# Patient Record
Sex: Female | Born: 1990 | Race: Black or African American | Hispanic: No | Marital: Single | Smoking: Current every day smoker
Health system: Southern US, Community
[De-identification: ages and names within clinical notes are randomized; demographics above are authoritative.]

## PROBLEM LIST (undated history)

## (undated) DIAGNOSIS — Z789 Other specified health status: Secondary | ICD-10-CM

## (undated) HISTORY — PX: NO PAST SURGERIES: SHX2092

## (undated) HISTORY — DX: Other specified health status: Z78.9

---
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
## 2013-04-02 ENCOUNTER — Ambulatory Visit (INDEPENDENT_AMBULATORY_CARE_PROVIDER_SITE_OTHER): Payer: Medicaid Other | Admitting: Obstetrics & Gynecology

## 2013-04-02 ENCOUNTER — Encounter: Payer: Self-pay | Admitting: Obstetrics & Gynecology

## 2013-04-02 VITALS — BP 120/70 | Ht 71.5 in | Wt 216.0 lb

## 2013-04-02 DIAGNOSIS — Z3200 Encounter for pregnancy test, result unknown: Secondary | ICD-10-CM

## 2013-04-02 DIAGNOSIS — Z3201 Encounter for pregnancy test, result positive: Secondary | ICD-10-CM

## 2013-04-02 LAB — POCT URINE PREGNANCY: Preg Test, Ur: POSITIVE

## 2013-04-02 MED ORDER — CITRANATAL B-CALM 20-1 MG & 2 X 25 MG PO MISC: 1.0000 | Freq: Every day | ORAL | Status: DC

## 2013-04-02 NOTE — Addendum Note (Signed)
Addended by: Criss Alvine on: 04/02/2013 02:50 PM   Modules accepted: Orders

## 2013-04-09 ENCOUNTER — Other Ambulatory Visit: Payer: Self-pay | Admitting: Obstetrics & Gynecology

## 2013-04-09 DIAGNOSIS — O3680X0 Pregnancy with inconclusive fetal viability, not applicable or unspecified: Secondary | ICD-10-CM

## 2013-04-14 ENCOUNTER — Ambulatory Visit (INDEPENDENT_AMBULATORY_CARE_PROVIDER_SITE_OTHER): Payer: Medicaid Other

## 2013-04-14 ENCOUNTER — Other Ambulatory Visit: Payer: Self-pay | Admitting: Adult Health

## 2013-04-14 ENCOUNTER — Other Ambulatory Visit: Payer: Self-pay | Admitting: Obstetrics & Gynecology

## 2013-04-14 DIAGNOSIS — O30009 Twin pregnancy, unspecified number of placenta and unspecified number of amniotic sacs, unspecified trimester: Secondary | ICD-10-CM

## 2013-04-14 DIAGNOSIS — O3680X0 Pregnancy with inconclusive fetal viability, not applicable or unspecified: Secondary | ICD-10-CM

## 2013-04-14 DIAGNOSIS — O3680X2 Pregnancy with inconclusive fetal viability, fetus 2: Secondary | ICD-10-CM

## 2013-04-14 MED ORDER — DOXYLAMINE-PYRIDOXINE 10-10 MG PO TBEC: 10.0000 mg | DELAYED_RELEASE_TABLET | Freq: Every day | ORAL | Status: DC | PRN

## 2013-04-14 NOTE — Progress Notes (Signed)
U/S- twin gestation, DI/DI, Baby A- CRL c/w 6+6wks, FHR=157BPM, baby B- CRL c/w 7+0 wks, FHR=142 BPM, cx long and closed, bilateral adnexa WNL

## 2013-04-15 ENCOUNTER — Ambulatory Visit (INDEPENDENT_AMBULATORY_CARE_PROVIDER_SITE_OTHER): Payer: Medicaid Other | Admitting: Women's Health

## 2013-04-15 ENCOUNTER — Encounter: Payer: Self-pay | Admitting: Obstetrics & Gynecology

## 2013-04-15 ENCOUNTER — Other Ambulatory Visit (HOSPITAL_COMMUNITY)
Admission: RE | Admit: 2013-04-15 | Discharge: 2013-04-15 | Disposition: A | Discharge status: Home / Self Care | Payer: Medicaid Other | Source: Ambulatory Visit | Attending: Obstetrics & Gynecology | Admitting: Obstetrics & Gynecology

## 2013-04-15 ENCOUNTER — Encounter: Payer: Self-pay | Admitting: Women's Health

## 2013-04-15 VITALS — BP 120/60 | Ht 70.0 in | Wt 211.8 lb

## 2013-04-15 DIAGNOSIS — Z01419 Encounter for gynecological examination (general) (routine) without abnormal findings: Secondary | ICD-10-CM | POA: Insufficient documentation

## 2013-04-15 DIAGNOSIS — O3091 Multiple gestation, unspecified, first trimester: Secondary | ICD-10-CM

## 2013-04-15 DIAGNOSIS — F1920 Other psychoactive substance dependence, uncomplicated: Secondary | ICD-10-CM

## 2013-04-15 DIAGNOSIS — Z113 Encounter for screening for infections with a predominantly sexual mode of transmission: Secondary | ICD-10-CM | POA: Insufficient documentation

## 2013-04-15 DIAGNOSIS — O30009 Twin pregnancy, unspecified number of placenta and unspecified number of amniotic sacs, unspecified trimester: Secondary | ICD-10-CM

## 2013-04-15 DIAGNOSIS — O210 Mild hyperemesis gravidarum: Secondary | ICD-10-CM

## 2013-04-15 DIAGNOSIS — O09899 Supervision of other high risk pregnancies, unspecified trimester: Secondary | ICD-10-CM

## 2013-04-15 DIAGNOSIS — Z331 Pregnant state, incidental: Secondary | ICD-10-CM

## 2013-04-15 DIAGNOSIS — O0991 Supervision of high risk pregnancy, unspecified, first trimester: Secondary | ICD-10-CM

## 2013-04-15 DIAGNOSIS — O0990 Supervision of high risk pregnancy, unspecified, unspecified trimester: Secondary | ICD-10-CM | POA: Insufficient documentation

## 2013-04-15 DIAGNOSIS — Z1389 Encounter for screening for other disorder: Secondary | ICD-10-CM

## 2013-04-15 LAB — CBC
HCT: 37.8 % (ref 36.0–46.0)
MCV: 84.8 fL (ref 78.0–100.0)
RBC: 4.46 MIL/uL (ref 3.87–5.11)
WBC: 8.2 10*3/uL (ref 4.0–10.5)

## 2013-04-15 LAB — POCT URINALYSIS DIPSTICK

## 2013-04-15 LAB — URINALYSIS
Glucose, UA: NEGATIVE mg/dL
Hgb urine dipstick: NEGATIVE
Leukocytes, UA: NEGATIVE
Nitrite: NEGATIVE
Specific Gravity, Urine: 1.029 (ref 1.005–1.030)
Urobilinogen, UA: 1 mg/dL (ref 0.0–1.0)
pH: 6.5 (ref 5.0–8.0)

## 2013-04-15 NOTE — Progress Notes (Signed)
New OB packet given. Consents signed.  

## 2013-04-15 NOTE — Progress Notes (Signed)
  Subjective:    Cindy Sosa is a 22 y.o. G62P1001 African American female at [redacted]w[redacted]d by 7wk u/s, being seen today for her first obstetrical visit.  Her obstetrical history is significant for uncomplicated term SVD 11years ago..  Pregnancy history fully reviewed. Last THC use 1 week ago- counseled on cessation.  Twin D-Di gestation!  Patient reports increased vaginal d/c w/o odor or vulvovaginal itching/irritation. N/V- was given diclegis samples yesterday. Medicaid pending- will call when receives card to get rx.  Denies cramping, bleeding, uti s/s.   Filed Vitals:   04/15/13 1102  BP: 120/60  Weight: 211 lb 12.8 oz (96.072 kg)    HISTORY: OB History   Grav Para Term Preterm Abortions TAB SAB Ect Mult Living   2 1 1       1      # Outc Date GA Lbr Len/2nd Wgt Sex Del Anes PTL Lv   1 TRM 8/03 104w0d   F SVD EPI  Yes   2 CUR              Past Medical History  Diagnosis Date  . Medical history non-contributory    Past Surgical History  Procedure Laterality Date  . No past surgeries     Family History  Problem Relation Age of Onset  . Hypertension Paternal Grandmother   . Hypertension Paternal Aunt      Exam    Pelvic Exam:    Perineum: Normal Perineum   Vulva: normal   Vagina:  normal mucosa, normal discharge, no palpable nodules   Uterus   normal size/shape/contour for GA w/ twins     Cervix: normal   Adnexa: Not palpable   Urinary: urethral meatus normal    System:     Skin: normal coloration and turgor, no rashes    Neurologic: oriented, normal mood   Extremities: normal strength, tone, and muscle mass   HEENT PERRLA   Mouth/Teeth mucous membranes moist   Cardiovascular: regular rate and rhythm   Respiratory:  appears well, vitals normal, no respiratory distress, acyanotic, normal RR   Abdomen: soft, non-tender    Thin prep pap smear obtained    Assessment:    Pregnancy: G2P1001 Patient Active Problem List   Diagnosis Date Noted  . Supervision of  high-risk pregnancy 04/15/2013    Priority: High  . Multiple gestation 04/15/2013    Priority: High      [redacted]w[redacted]d G2P1001 New OB visit Di-Di Twin gestation N/V pregnancy THC use    Plan:     Initial labs drawn Continue prenatal vitamins Problem list reviewed and updated Reviewed n/v relief measures and warning s/s to report, to call when receives medicaid so can rx diclegis (has samples) Reviewed recommended weight gain based on pre-gravid BMI Encouraged well-balanced diet Genetic Screening discussed Integrated Screen: requested Cystic fibrosis screening discussed requested Ultrasound discussed; fetal survey: requested Follow up in 4 weeks for 1st NT/IT and visit [facility] form completed and faxed   Marge Duncans 04/15/2013 11:29 AM

## 2013-04-15 NOTE — Addendum Note (Signed)
Addended by: Cheral Marker on: 04/15/2013 05:59 PM   Modules accepted: Orders

## 2013-04-15 NOTE — Patient Instructions (Signed)
Pregnancy - First Trimester  During sexual intercourse, millions of sperm go into the vagina. Only 1 sperm will penetrate and fertilize the female egg while it is in the Fallopian tube. One week later, the fertilized egg implants into the wall of the uterus. An embryo begins to develop into a baby. At 6 to 8 weeks, the eyes and face are formed and the heartbeat can be seen on ultrasound. At the end of 12 weeks (first trimester), all the baby's organs are formed. Now that you are pregnant, you will want to do everything you can to have a healthy baby. Two of the most important things are to get good prenatal care and follow your caregiver's instructions. Prenatal care is all the medical care you receive before the baby's birth. It is given to prevent, find, and treat problems during the pregnancy and childbirth.  PRENATAL EXAMS  · During prenatal visits, your weight, blood pressure, and urine are checked. This is done to make sure you are healthy and progressing normally during the pregnancy.  · A pregnant woman should gain 25 to 35 pounds during the pregnancy. However, if you are overweight or underweight, your caregiver will advise you regarding your weight.  · Your caregiver will ask and answer questions for you.  · Blood work, cervical cultures, other necessary tests, and a Pap test are done during your prenatal exams. These tests are done to check on your health and the probable health of your baby. Tests are strongly recommended and done for HIV with your permission. This is the virus that causes AIDS. These tests are done because medicines can be given to help prevent your baby from being born with this infection should you have been infected without knowing it. Blood work is also used to find out your blood type, previous infections, and follow your blood levels (hemoglobin).  · Low hemoglobin (anemia) is common during pregnancy. Iron and vitamins are given to help prevent this. Later in the pregnancy, blood  tests for diabetes will be done along with any other tests if any problems develop.  · You may need other tests to make sure you and the baby are doing well.  CHANGES DURING THE FIRST TRIMESTER   Your body goes through many changes during pregnancy. They vary from person to person. Talk to your caregiver about changes you notice and are concerned about. Changes can include:  · Your menstrual period stops.  · The egg and sperm carry the genes that determine what you look like. Genes from you and your partner are forming a baby. The female genes determine whether the baby is a boy or a girl.  · Your body increases in girth and you may feel bloated.  · Feeling sick to your stomach (nauseous) and throwing up (vomiting). If the vomiting is uncontrollable, call your caregiver.  · Your breasts will begin to enlarge and become tender.  · Your nipples may stick out more and become darker.  · The need to urinate more. Painful urination may mean you have a bladder infection.  · Tiring easily.  · Loss of appetite.  · Cravings for certain kinds of food.  · At first, you may gain or lose a couple of pounds.  · You may have changes in your emotions from day to day (excited to be pregnant or concerned something may go wrong with the pregnancy and baby).  · You may have more vivid and strange dreams.  HOME CARE INSTRUCTIONS   ·   It is very important to avoid all smoking, alcohol and non-prescribed drugs during your pregnancy. These affect the formation and growth of the baby. Avoid chemicals while pregnant to ensure the delivery of a healthy infant.  · Start your prenatal visits by the 12th week of pregnancy. They are usually scheduled monthly at first, then more often in the last 2 months before delivery. Keep your caregiver's appointments. Follow your caregiver's instructions regarding medicine use, blood and lab tests, exercise, and diet.  · During pregnancy, you are providing food for you and your baby. Eat regular, well-balanced  meals. Choose foods such as meat, fish, milk and other low fat dairy products, vegetables, fruits, and whole-grain breads and cereals. Your caregiver will tell you of the ideal weight gain.  · You can help morning sickness by keeping soda crackers at the bedside. Eat a couple before arising in the morning. You may want to use the crackers without salt on them.  · Eating 4 to 5 small meals rather than 3 large meals a day also may help the nausea and vomiting.  · Drinking liquids between meals instead of during meals also seems to help nausea and vomiting.  · A physical sexual relationship may be continued throughout pregnancy if there are no other problems. Problems may be early (premature) leaking of amniotic fluid from the membranes, vaginal bleeding, or belly (abdominal) pain.  · Exercise regularly if there are no restrictions. Check with your caregiver or physical therapist if you are unsure of the safety of some of your exercises. Greater weight gain will occur in the last 2 trimesters of pregnancy. Exercising will help:  · Control your weight.  · Keep you in shape.  · Prepare you for labor and delivery.  · Help you lose your pregnancy weight after you deliver your baby.  · Wear a good support or jogging bra for breast tenderness during pregnancy. This may help if worn during sleep too.  · Ask when prenatal classes are available. Begin classes when they are offered.  · Do not use hot tubs, steam rooms, or saunas.  · Wear your seat belt when driving. This protects you and your baby if you are in an accident.  · Avoid raw meat, uncooked cheese, cat litter boxes, and soil used by cats throughout the pregnancy. These carry germs that can cause birth defects in the baby.  · The first trimester is a good time to visit your dentist for your dental health. Getting your teeth cleaned is okay. Use a softer toothbrush and brush gently during pregnancy.  · Ask for help if you have financial, counseling, or nutritional needs  during pregnancy. Your caregiver will be able to offer counseling for these needs as well as refer you for other special needs.  · Do not take any medicines or herbs unless told by your caregiver.  · Inform your caregiver if there is any mental or physical domestic violence.  · Make a list of emergency phone numbers of family, friends, hospital, and police and fire departments.  · Write down your questions. Take them to your prenatal visit.  · Do not douche.  · Do not cross your legs.  · If you have to stand for long periods of time, rotate you feet or take small steps in a circle.  · You may have more vaginal secretions that may require a sanitary pad. Do not use tampons or scented sanitary pads.  MEDICINES AND DRUG USE IN PREGNANCY  ·   Take prenatal vitamins as directed. The vitamin should contain 1 milligram of folic acid. Keep all vitamins out of reach of children. Only a couple vitamins or tablets containing iron may be fatal to a baby or young child when ingested.  · Avoid use of all medicines, including herbs, over-the-counter medicines, not prescribed or suggested by your caregiver. Only take over-the-counter or prescription medicines for pain, discomfort, or fever as directed by your caregiver. Do not use aspirin, ibuprofen, or naproxen unless directed by your caregiver.  · Let your caregiver also know about herbs you may be using.  · Alcohol is related to a number of birth defects. This includes fetal alcohol syndrome. All alcohol, in any form, should be avoided completely. Smoking will cause low birth rate and premature babies.  · Street or illegal drugs are very harmful to the baby. They are absolutely forbidden. A baby born to an addicted mother will be addicted at birth. The baby will go through the same withdrawal an adult does.  · Let your caregiver know about any medicines that you have to take and for what reason you take them.  SEEK MEDICAL CARE IF:   You have any concerns or worries during your  pregnancy. It is better to call with your questions if you feel they cannot wait, rather than worry about them.  SEEK IMMEDIATE MEDICAL CARE IF:   · An unexplained oral temperature above 102° F (38.9° C) develops, or as your caregiver suggests.  · You have leaking of fluid from the vagina (birth canal). If leaking membranes are suspected, take your temperature and inform your caregiver of this when you call.  · There is vaginal spotting or bleeding. Notify your caregiver of the amount and how many pads are used.  · You develop a bad smelling vaginal discharge with a change in the color.  · You continue to feel sick to your stomach (nauseated) and have no relief from remedies suggested. You vomit blood or coffee ground-like materials.  · You lose more than 2 pounds of weight in 1 week.  · You gain more than 2 pounds of weight in 1 week and you notice swelling of your face, hands, feet, or legs.  · You gain 5 pounds or more in 1 week (even if you do not have swelling of your hands, face, legs, or feet).  · You get exposed to German measles and have never had them.  · You are exposed to fifth disease or chickenpox.  · You develop belly (abdominal) pain. Round ligament discomfort is a common non-cancerous (benign) cause of abdominal pain in pregnancy. Your caregiver still must evaluate this.  · You develop headache, fever, diarrhea, pain with urination, or shortness of breath.  · You fall or are in a car accident or have any kind of trauma.  · There is mental or physical violence in your home.  Document Released: 09/19/2001 Document Revised: 06/19/2012 Document Reviewed: 03/23/2009  [employer]® Patient Information ©2014 [employer].

## 2013-04-16 LAB — DRUG SCREEN, URINE, NO CONFIRMATION
Barbiturate Quant, Ur: NEGATIVE
Benzodiazepines.: NEGATIVE
Cocaine Metabolites: NEGATIVE
Opiate Screen, Urine: NEGATIVE
Phencyclidine (PCP): NEGATIVE

## 2013-04-16 LAB — ABO AND RH: Rh Type: POSITIVE

## 2013-04-16 LAB — ANTIBODY SCREEN: Antibody Screen: NEGATIVE

## 2013-04-16 LAB — HEPATITIS B SURFACE ANTIGEN: Hepatitis B Surface Ag: NEGATIVE

## 2013-04-16 LAB — SICKLE CELL SCREEN: Sickle Cell Screen: NEGATIVE

## 2013-04-17 ENCOUNTER — Encounter: Payer: Self-pay | Admitting: Women's Health

## 2013-04-17 DIAGNOSIS — F1290 Cannabis use, unspecified, uncomplicated: Secondary | ICD-10-CM | POA: Insufficient documentation

## 2013-04-18 LAB — CYSTIC FIBROSIS DIAGNOSTIC STUDY

## 2013-04-19 ENCOUNTER — Encounter: Payer: Self-pay | Admitting: Women's Health

## 2013-05-13 ENCOUNTER — Other Ambulatory Visit: Payer: Self-pay | Admitting: Obstetrics & Gynecology

## 2013-05-13 ENCOUNTER — Ambulatory Visit (INDEPENDENT_AMBULATORY_CARE_PROVIDER_SITE_OTHER): Payer: Medicaid Other

## 2013-05-13 ENCOUNTER — Ambulatory Visit (INDEPENDENT_AMBULATORY_CARE_PROVIDER_SITE_OTHER): Payer: Medicaid Other | Admitting: Obstetrics & Gynecology

## 2013-05-13 ENCOUNTER — Encounter: Payer: Self-pay | Admitting: Obstetrics & Gynecology

## 2013-05-13 VITALS — BP 110/80 | Wt 220.0 lb

## 2013-05-13 DIAGNOSIS — F1920 Other psychoactive substance dependence, uncomplicated: Secondary | ICD-10-CM

## 2013-05-13 DIAGNOSIS — O30009 Twin pregnancy, unspecified number of placenta and unspecified number of amniotic sacs, unspecified trimester: Secondary | ICD-10-CM

## 2013-05-13 DIAGNOSIS — Z1389 Encounter for screening for other disorder: Secondary | ICD-10-CM

## 2013-05-13 DIAGNOSIS — Z331 Pregnant state, incidental: Secondary | ICD-10-CM

## 2013-05-13 DIAGNOSIS — O99320 Drug use complicating pregnancy, unspecified trimester: Secondary | ICD-10-CM

## 2013-05-13 DIAGNOSIS — Z36 Encounter for antenatal screening of mother: Secondary | ICD-10-CM

## 2013-05-13 DIAGNOSIS — O0991 Supervision of high risk pregnancy, unspecified, first trimester: Secondary | ICD-10-CM

## 2013-05-13 LAB — POCT URINALYSIS DIPSTICK
Blood, UA: NEGATIVE
Glucose, UA: NEGATIVE
Ketones, UA: NEGATIVE

## 2013-05-13 NOTE — Patient Instructions (Signed)
Pregnancy - Second Trimester The second trimester of pregnancy (3 to 6 months) is a period of rapid growth for you and your baby. At the end of the sixth month, your baby is about 9 inches long and weighs 1 1/2 pounds. You will begin to feel the baby move between 18 and 20 weeks of the pregnancy. This is called quickening. Weight gain is faster. A clear fluid (colostrum) may leak out of your breasts. You may feel small contractions of the womb (uterus). This is known as false labor or Braxton-Hicks contractions. This is like a practice for labor when the baby is ready to be born. Usually, the problems with morning sickness have usually passed by the end of your first trimester. Some women develop small dark blotches (called cholasma, mask of pregnancy) on their face that usually goes away after the baby is born. Exposure to the sun makes the blotches worse. Acne may also develop in some pregnant women and pregnant women who have acne, may find that it goes away. PRENATAL EXAMS  Blood work may continue to be done during prenatal exams. These tests are done to check on your health and the probable health of your baby. Blood work is used to follow your blood levels (hemoglobin). Anemia (low hemoglobin) is common during pregnancy. Iron and vitamins are given to help prevent this. You will also be checked for diabetes between 24 and 28 weeks of the pregnancy. Some of the previous blood tests may be repeated.  The size of the uterus is measured during each visit. This is to make sure that the baby is continuing to grow properly according to the dates of the pregnancy.  Your blood pressure is checked every prenatal visit. This is to make sure you are not getting toxemia.  Your urine is checked to make sure you do not have an infection, diabetes or protein in the urine.  Your weight is checked often to make sure gains are happening at the suggested rate. This is to ensure that both you and your baby are  growing normally.  Sometimes, an ultrasound is performed to confirm the proper growth and development of the baby. This is a test which bounces harmless sound waves off the baby so your caregiver can more accurately determine due dates. Sometimes, a test is done on the amniotic fluid surrounding the baby. This test is called an amniocentesis. The amniotic fluid is obtained by sticking a needle into the belly (abdomen). This is done to check the chromosomes in instances where there is a concern about possible genetic problems with the baby. It is also sometimes done near the end of pregnancy if an early delivery is required. In this case, it is done to help make sure the baby's lungs are mature enough for the baby to live outside of the womb. CHANGES OCCURING IN THE SECOND TRIMESTER OF PREGNANCY Your body goes through many changes during pregnancy. They vary from person to person. Talk to your caregiver about changes you notice that you are concerned about.  During the second trimester, you will likely have an increase in your appetite. It is normal to have cravings for certain foods. This varies from person to person and pregnancy to pregnancy.  Your lower abdomen will begin to bulge.  You may have to urinate more often because the uterus and baby are pressing on your bladder. It is also common to get more bladder infections during pregnancy. You can help this by drinking lots of fluids   and emptying your bladder before and after intercourse.  You may begin to get stretch marks on your hips, abdomen, and breasts. These are normal changes in the body during pregnancy. There are no exercises or medicines to take that prevent this change.  You may begin to develop swollen and bulging veins (varicose veins) in your legs. Wearing support hose, elevating your feet for 15 minutes, 3 to 4 times a day and limiting salt in your diet helps lessen the problem.  Heartburn may develop as the uterus grows and  pushes up against the stomach. Antacids recommended by your caregiver helps with this problem. Also, eating smaller meals 4 to 5 times a day helps.  Constipation can be treated with a stool softener or adding bulk to your diet. Drinking lots of fluids, and eating vegetables, fruits, and whole grains are helpful.  Exercising is also helpful. If you have been very active up until your pregnancy, most of these activities can be continued during your pregnancy. If you have been less active, it is helpful to start an exercise program such as walking.  Hemorrhoids may develop at the end of the second trimester. Warm sitz baths and hemorrhoid cream recommended by your caregiver helps hemorrhoid problems.  Backaches may develop during this time of your pregnancy. Avoid heavy lifting, wear low heal shoes, and practice good posture to help with backache problems.  Some pregnant women develop tingling and numbness of their hand and fingers because of swelling and tightening of ligaments in the wrist (carpel tunnel syndrome). This goes away after the baby is born.  As your breasts enlarge, you may have to get a bigger bra. Get a comfortable, cotton, support bra. Do not get a nursing bra until the last month of the pregnancy if you will be nursing the baby.  You may get a dark line from your belly button to the pubic area called the linea nigra.  You may develop rosy cheeks because of increase blood flow to the face.  You may develop spider looking lines of the face, neck, arms, and chest. These go away after the baby is born. HOME CARE INSTRUCTIONS   It is extremely important to avoid all smoking, herbs, alcohol, and unprescribed drugs during your pregnancy. These chemicals affect the formation and growth of the baby. Avoid these chemicals throughout the pregnancy to ensure the delivery of a healthy infant.  Most of your home care instructions are the same as suggested for the first trimester of your  pregnancy. Keep your caregiver's appointments. Follow your caregiver's instructions regarding medicine use, exercise, and diet.  During pregnancy, you are providing food for you and your baby. Continue to eat regular, well-balanced meals. Choose foods such as meat, fish, milk and other low fat dairy products, vegetables, fruits, and whole-grain breads and cereals. Your caregiver will tell you of the ideal weight gain.  A physical sexual relationship may be continued up until near the end of pregnancy if there are no other problems. Problems could include early (premature) leaking of amniotic fluid from the membranes, vaginal bleeding, abdominal pain, or other medical or pregnancy problems.  Exercise regularly if there are no restrictions. Check with your caregiver if you are unsure of the safety of some of your exercises. The greatest weight gain will occur in the last 2 trimesters of pregnancy. Exercise will help you:  Control your weight.  Get you in shape for labor and delivery.  Lose weight after you have the baby.  Wear   a good support or jogging bra for breast tenderness during pregnancy. This may help if worn during sleep. Pads or tissues may be used in the bra if you are leaking colostrum.  Do not use hot tubs, steam rooms or saunas throughout the pregnancy.  Wear your seat belt at all times when driving. This protects you and your baby if you are in an accident.  Avoid raw meat, uncooked cheese, cat litter boxes, and soil used by cats. These carry germs that can cause birth defects in the baby.  The second trimester is also a good time to visit your dentist for your dental health if this has not been done yet. Getting your teeth cleaned is okay. Use a soft toothbrush. Brush gently during pregnancy.  It is easier to leak urine during pregnancy. Tightening up and strengthening the pelvic muscles will help with this problem. Practice stopping your urination while you are going to the  bathroom. These are the same muscles you need to strengthen. It is also the muscles you would use as if you were trying to stop from passing gas. You can practice tightening these muscles up 10 times a set and repeating this about 3 times per day. Once you know what muscles to tighten up, do not perform these exercises during urination. It is more likely to contribute to an infection by backing up the urine.  Ask for help if you have financial, counseling, or nutritional needs during pregnancy. Your caregiver will be able to offer counseling for these needs as well as refer you for other special needs.  Your skin may become oily. If so, wash your face with mild soap, use non-greasy moisturizer and oil or cream based makeup. MEDICINES AND DRUG USE IN PREGNANCY  Take prenatal vitamins as directed. The vitamin should contain 1 milligram of folic acid. Keep all vitamins out of reach of children. Only a couple vitamins or tablets containing iron may be fatal to a baby or young child when ingested.  Avoid use of all medicines, including herbs, over-the-counter medicines, not prescribed or suggested by your caregiver. Only take over-the-counter or prescription medicines for pain, discomfort, or fever as directed by your caregiver. Do not use aspirin.  Let your caregiver also know about herbs you may be using.  Alcohol is related to a number of birth defects. This includes fetal alcohol syndrome. All alcohol, in any form, should be avoided completely. Smoking will cause low birth rate and premature babies.  Street or illegal drugs are very harmful to the baby. They are absolutely forbidden. A baby born to an addicted mother will be addicted at birth. The baby will go through the same withdrawal an adult does. SEEK MEDICAL CARE IF:  You have any concerns or worries during your pregnancy. It is better to call with your questions if you feel they cannot wait, rather than worry about them. SEEK IMMEDIATE  MEDICAL CARE IF:   An unexplained oral temperature above 102 F (38.9 C) develops, or as your caregiver suggests.  You have leaking of fluid from the vagina (birth canal). If leaking membranes are suspected, take your temperature and tell your caregiver of this when you call.  There is vaginal spotting, bleeding, or passing clots. Tell your caregiver of the amount and how many pads are used. Light spotting in pregnancy is common, especially following intercourse.  You develop a bad smelling vaginal discharge with a change in the color from clear to white.  You continue to feel   sick to your stomach (nauseated) and have no relief from remedies suggested. You vomit blood or coffee ground-like materials.  You lose more than 2 pounds of weight or gain more than 2 pounds of weight over 1 week, or as suggested by your caregiver.  You notice swelling of your face, hands, feet, or legs.  You get exposed to German measles and have never had them.  You are exposed to fifth disease or chickenpox.  You develop belly (abdominal) pain. Round ligament discomfort is a common non-cancerous (benign) cause of abdominal pain in pregnancy. Your caregiver still must evaluate you.  You develop a bad headache that does not go away.  You develop fever, diarrhea, pain with urination, or shortness of breath.  You develop visual problems, blurry, or double vision.  You fall or are in a car accident or any kind of trauma.  There is mental or physical violence at home. Document Released: 09/19/2001 Document Revised: 06/19/2012 Document Reviewed: 03/24/2009 [employer] Patient Information 2014 [employer].  

## 2013-05-13 NOTE — Progress Notes (Signed)
Sonogram reviewed see report No bleeding  No other complaints 1st IT draw today

## 2013-05-13 NOTE — Progress Notes (Addendum)
U/S(11+1wks)-twins, Di-Di membrane noted, Baby A- active fetus, CRL c/w dates, NB present, NT=1.54mm, Baby B- active fetus, CRL c/w dates, NB present, NT=1.74mm, cx long and closed(3.4cm), bilateral adnexa WNL

## 2013-05-13 NOTE — Progress Notes (Signed)
Had U/S today. 

## 2013-06-10 ENCOUNTER — Other Ambulatory Visit: Payer: Self-pay | Admitting: Women's Health

## 2013-06-10 ENCOUNTER — Encounter: Payer: Self-pay | Admitting: Women's Health

## 2013-06-10 ENCOUNTER — Ambulatory Visit (INDEPENDENT_AMBULATORY_CARE_PROVIDER_SITE_OTHER): Payer: Medicaid Other | Admitting: Women's Health

## 2013-06-10 VITALS — BP 112/70 | Wt 233.0 lb

## 2013-06-10 DIAGNOSIS — O30009 Twin pregnancy, unspecified number of placenta and unspecified number of amniotic sacs, unspecified trimester: Secondary | ICD-10-CM

## 2013-06-10 DIAGNOSIS — O0992 Supervision of high risk pregnancy, unspecified, second trimester: Secondary | ICD-10-CM

## 2013-06-10 DIAGNOSIS — Z331 Pregnant state, incidental: Secondary | ICD-10-CM

## 2013-06-10 DIAGNOSIS — Z1389 Encounter for screening for other disorder: Secondary | ICD-10-CM

## 2013-06-10 DIAGNOSIS — O09899 Supervision of other high risk pregnancies, unspecified trimester: Secondary | ICD-10-CM

## 2013-06-10 DIAGNOSIS — F1920 Other psychoactive substance dependence, uncomplicated: Secondary | ICD-10-CM

## 2013-06-10 LAB — POCT URINALYSIS DIPSTICK
Blood, UA: NEGATIVE
Glucose, UA: NEGATIVE
Ketones, UA: NEGATIVE

## 2013-06-10 NOTE — Progress Notes (Signed)
Having some cramping off and on. 

## 2013-06-10 NOTE — Progress Notes (Signed)
Denies uc's, lof, vb, urinary frequency, urgency, hesitancy, or dysuria.  Rare cramp when hasn't emptied bladder.  Reviewed emptying bladder as needed, warning s/s to report.  All questions answered. 2nd IT today. F/U in 4wks for anatomy u/s and visit.

## 2013-06-10 NOTE — Patient Instructions (Signed)
Pregnancy - Second Trimester The second trimester of pregnancy (3 to 6 months) is a period of rapid growth for you and your baby. At the end of the sixth month, your baby is about 9 inches long and weighs 1 1/2 pounds. You will begin to feel the baby move between 18 and 20 weeks of the pregnancy. This is called quickening. Weight gain is faster. A clear fluid (colostrum) may leak out of your breasts. You may feel small contractions of the womb (uterus). This is known as false labor or Braxton-Hicks contractions. This is like a practice for labor when the baby is ready to be born. Usually, the problems with morning sickness have usually passed by the end of your first trimester. Some women develop small dark blotches (called cholasma, mask of pregnancy) on their face that usually goes away after the baby is born. Exposure to the sun makes the blotches worse. Acne may also develop in some pregnant women and pregnant women who have acne, may find that it goes away. PRENATAL EXAMS  Blood work may continue to be done during prenatal exams. These tests are done to check on your health and the probable health of your baby. Blood work is used to follow your blood levels (hemoglobin). Anemia (low hemoglobin) is common during pregnancy. Iron and vitamins are given to help prevent this. You will also be checked for diabetes between 24 and 28 weeks of the pregnancy. Some of the previous blood tests may be repeated.  The size of the uterus is measured during each visit. This is to make sure that the baby is continuing to grow properly according to the dates of the pregnancy.  Your blood pressure is checked every prenatal visit. This is to make sure you are not getting toxemia.  Your urine is checked to make sure you do not have an infection, diabetes or protein in the urine.  Your weight is checked often to make sure gains are happening at the suggested rate. This is to ensure that both you and your baby are  growing normally.  Sometimes, an ultrasound is performed to confirm the proper growth and development of the baby. This is a test which bounces harmless sound waves off the baby so your caregiver can more accurately determine due dates. Sometimes, a test is done on the amniotic fluid surrounding the baby. This test is called an amniocentesis. The amniotic fluid is obtained by sticking a needle into the belly (abdomen). This is done to check the chromosomes in instances where there is a concern about possible genetic problems with the baby. It is also sometimes done near the end of pregnancy if an early delivery is required. In this case, it is done to help make sure the baby's lungs are mature enough for the baby to live outside of the womb. CHANGES OCCURING IN THE SECOND TRIMESTER OF PREGNANCY Your body goes through many changes during pregnancy. They vary from person to person. Talk to your caregiver about changes you notice that you are concerned about.  During the second trimester, you will likely have an increase in your appetite. It is normal to have cravings for certain foods. This varies from person to person and pregnancy to pregnancy.  Your lower abdomen will begin to bulge.  You may have to urinate more often because the uterus and baby are pressing on your bladder. It is also common to get more bladder infections during pregnancy. You can help this by drinking lots of fluids   and emptying your bladder before and after intercourse.  You may begin to get stretch marks on your hips, abdomen, and breasts. These are normal changes in the body during pregnancy. There are no exercises or medicines to take that prevent this change.  You may begin to develop swollen and bulging veins (varicose veins) in your legs. Wearing support hose, elevating your feet for 15 minutes, 3 to 4 times a day and limiting salt in your diet helps lessen the problem.  Heartburn may develop as the uterus grows and  pushes up against the stomach. Antacids recommended by your caregiver helps with this problem. Also, eating smaller meals 4 to 5 times a day helps.  Constipation can be treated with a stool softener or adding bulk to your diet. Drinking lots of fluids, and eating vegetables, fruits, and whole grains are helpful.  Exercising is also helpful. If you have been very active up until your pregnancy, most of these activities can be continued during your pregnancy. If you have been less active, it is helpful to start an exercise program such as walking.  Hemorrhoids may develop at the end of the second trimester. Warm sitz baths and hemorrhoid cream recommended by your caregiver helps hemorrhoid problems.  Backaches may develop during this time of your pregnancy. Avoid heavy lifting, wear low heal shoes, and practice good posture to help with backache problems.  Some pregnant women develop tingling and numbness of their hand and fingers because of swelling and tightening of ligaments in the wrist (carpel tunnel syndrome). This goes away after the baby is born.  As your breasts enlarge, you may have to get a bigger bra. Get a comfortable, cotton, support bra. Do not get a nursing bra until the last month of the pregnancy if you will be nursing the baby.  You may get a dark line from your belly button to the pubic area called the linea nigra.  You may develop rosy cheeks because of increase blood flow to the face.  You may develop spider looking lines of the face, neck, arms, and chest. These go away after the baby is born. HOME CARE INSTRUCTIONS   It is extremely important to avoid all smoking, herbs, alcohol, and unprescribed drugs during your pregnancy. These chemicals affect the formation and growth of the baby. Avoid these chemicals throughout the pregnancy to ensure the delivery of a healthy infant.  Most of your home care instructions are the same as suggested for the first trimester of your  pregnancy. Keep your caregiver's appointments. Follow your caregiver's instructions regarding medicine use, exercise, and diet.  During pregnancy, you are providing food for you and your baby. Continue to eat regular, well-balanced meals. Choose foods such as meat, fish, milk and other low fat dairy products, vegetables, fruits, and whole-grain breads and cereals. Your caregiver will tell you of the ideal weight gain.  A physical sexual relationship may be continued up until near the end of pregnancy if there are no other problems. Problems could include early (premature) leaking of amniotic fluid from the membranes, vaginal bleeding, abdominal pain, or other medical or pregnancy problems.  Exercise regularly if there are no restrictions. Check with your caregiver if you are unsure of the safety of some of your exercises. The greatest weight gain will occur in the last 2 trimesters of pregnancy. Exercise will help you:  Control your weight.  Get you in shape for labor and delivery.  Lose weight after you have the baby.  Wear   a good support or jogging bra for breast tenderness during pregnancy. This may help if worn during sleep. Pads or tissues may be used in the bra if you are leaking colostrum.  Do not use hot tubs, steam rooms or saunas throughout the pregnancy.  Wear your seat belt at all times when driving. This protects you and your baby if you are in an accident.  Avoid raw meat, uncooked cheese, cat litter boxes, and soil used by cats. These carry germs that can cause birth defects in the baby.  The second trimester is also a good time to visit your dentist for your dental health if this has not been done yet. Getting your teeth cleaned is okay. Use a soft toothbrush. Brush gently during pregnancy.  It is easier to leak urine during pregnancy. Tightening up and strengthening the pelvic muscles will help with this problem. Practice stopping your urination while you are going to the  bathroom. These are the same muscles you need to strengthen. It is also the muscles you would use as if you were trying to stop from passing gas. You can practice tightening these muscles up 10 times a set and repeating this about 3 times per day. Once you know what muscles to tighten up, do not perform these exercises during urination. It is more likely to contribute to an infection by backing up the urine.  Ask for help if you have financial, counseling, or nutritional needs during pregnancy. Your caregiver will be able to offer counseling for these needs as well as refer you for other special needs.  Your skin may become oily. If so, wash your face with mild soap, use non-greasy moisturizer and oil or cream based makeup. MEDICINES AND DRUG USE IN PREGNANCY  Take prenatal vitamins as directed. The vitamin should contain 1 milligram of folic acid. Keep all vitamins out of reach of children. Only a couple vitamins or tablets containing iron may be fatal to a baby or young child when ingested.  Avoid use of all medicines, including herbs, over-the-counter medicines, not prescribed or suggested by your caregiver. Only take over-the-counter or prescription medicines for pain, discomfort, or fever as directed by your caregiver. Do not use aspirin.  Let your caregiver also know about herbs you may be using.  Alcohol is related to a number of birth defects. This includes fetal alcohol syndrome. All alcohol, in any form, should be avoided completely. Smoking will cause low birth rate and premature babies.  Street or illegal drugs are very harmful to the baby. They are absolutely forbidden. A baby born to an addicted mother will be addicted at birth. The baby will go through the same withdrawal an adult does. SEEK MEDICAL CARE IF:  You have any concerns or worries during your pregnancy. It is better to call with your questions if you feel they cannot wait, rather than worry about them. SEEK IMMEDIATE  MEDICAL CARE IF:   An unexplained oral temperature above 102 F (38.9 C) develops, or as your caregiver suggests.  You have leaking of fluid from the vagina (birth canal). If leaking membranes are suspected, take your temperature and tell your caregiver of this when you call.  There is vaginal spotting, bleeding, or passing clots. Tell your caregiver of the amount and how many pads are used. Light spotting in pregnancy is common, especially following intercourse.  You develop a bad smelling vaginal discharge with a change in the color from clear to white.  You continue to feel   sick to your stomach (nauseated) and have no relief from remedies suggested. You vomit blood or coffee ground-like materials.  You lose more than 2 pounds of weight or gain more than 2 pounds of weight over 1 week, or as suggested by your caregiver.  You notice swelling of your face, hands, feet, or legs.  You get exposed to German measles and have never had them.  You are exposed to fifth disease or chickenpox.  You develop belly (abdominal) pain. Round ligament discomfort is a common non-cancerous (benign) cause of abdominal pain in pregnancy. Your caregiver still must evaluate you.  You develop a bad headache that does not go away.  You develop fever, diarrhea, pain with urination, or shortness of breath.  You develop visual problems, blurry, or double vision.  You fall or are in a car accident or any kind of trauma.  There is mental or physical violence at home. Document Released: 09/19/2001 Document Revised: 06/19/2012 Document Reviewed: 03/24/2009 [employer] Patient Information 2014 [employer].  

## 2013-06-18 LAB — MATERNAL SCREEN, INTEGRATED #2
AFP MoM: 1.5
AFP, Serum: 44.2 ng/mL
Age risk Down Syndrome: 1:1000 {titer}
Estriol, Free: 0.79 ng/mL
MSS Down Syndrome: <1:5000 {titer}
NT MoM: 0.95
PAPP-A MoM: 2.07
PAPP-A: 1193 ng/mL
hCG MoM: 1.49
hCG, Serum: 50.5 IU/mL

## 2013-06-21 ENCOUNTER — Encounter: Payer: Self-pay | Admitting: Women's Health

## 2013-07-08 ENCOUNTER — Other Ambulatory Visit: Payer: Self-pay | Admitting: Obstetrics & Gynecology

## 2013-07-08 ENCOUNTER — Encounter: Payer: Self-pay | Admitting: Obstetrics & Gynecology

## 2013-07-08 ENCOUNTER — Other Ambulatory Visit: Payer: Self-pay | Admitting: Women's Health

## 2013-07-08 ENCOUNTER — Ambulatory Visit (INDEPENDENT_AMBULATORY_CARE_PROVIDER_SITE_OTHER): Payer: Medicaid Other | Admitting: Obstetrics & Gynecology

## 2013-07-08 ENCOUNTER — Encounter: Payer: Self-pay | Admitting: Women's Health

## 2013-07-08 ENCOUNTER — Ambulatory Visit (INDEPENDENT_AMBULATORY_CARE_PROVIDER_SITE_OTHER): Payer: Medicaid Other

## 2013-07-08 VITALS — BP 110/70 | Wt 233.0 lb

## 2013-07-08 DIAGNOSIS — O30009 Twin pregnancy, unspecified number of placenta and unspecified number of amniotic sacs, unspecified trimester: Secondary | ICD-10-CM

## 2013-07-08 DIAGNOSIS — Z331 Pregnant state, incidental: Secondary | ICD-10-CM

## 2013-07-08 DIAGNOSIS — F1920 Other psychoactive substance dependence, uncomplicated: Secondary | ICD-10-CM

## 2013-07-08 DIAGNOSIS — O0992 Supervision of high risk pregnancy, unspecified, second trimester: Secondary | ICD-10-CM

## 2013-07-08 DIAGNOSIS — Z1389 Encounter for screening for other disorder: Secondary | ICD-10-CM

## 2013-07-08 DIAGNOSIS — Z23 Encounter for immunization: Secondary | ICD-10-CM

## 2013-07-08 LAB — POCT URINALYSIS DIPSTICK: Nitrite, UA: NEGATIVE

## 2013-07-08 MED ORDER — INFLUENZA VAC SPLIT QUAD 0.5 ML IM SUSP
0.5000 mL | Freq: Once | INTRAMUSCULAR | Status: AC
  Administered 2013-07-08: 0.5 mL via INTRAMUSCULAR

## 2013-07-08 NOTE — Progress Notes (Signed)
Sonogram reviewed and report done BP weight and urine results all reviewed and noted. Patient reports good fetal movement, denies any bleeding and no rupture of membranes symptoms or regular contractions. Patient is without complaints. All questions were answered.  

## 2013-07-08 NOTE — Progress Notes (Addendum)
U/S(19+1wks)- Twin IUP, membrane noted Baby A-located on Maternal LT side, Baby B- located on maternal RT side, Post. Gr -0 plac, cx long and closed CX-3.8cm, bilateral adnexa wnl Baby A- active fetus, meas c/w dates, 11oz,  fluid wnl, FHR- 157 bpm, no major abnl noted, female fetus Baby B- active fetus, meas c/w dates, 10 oz, fluid wnl, FHR-152 bpm, no major abnl noted, female fetus EFW Discordance 6%

## 2013-07-08 NOTE — Progress Notes (Signed)
U/S(19+1wks)- Baby A- Baby B-

## 2013-08-05 ENCOUNTER — Encounter: Payer: Self-pay | Admitting: Women's Health

## 2013-08-05 ENCOUNTER — Ambulatory Visit (INDEPENDENT_AMBULATORY_CARE_PROVIDER_SITE_OTHER): Payer: Medicaid Other | Admitting: Women's Health

## 2013-08-05 VITALS — BP 102/72 | Wt 235.0 lb

## 2013-08-05 DIAGNOSIS — F1920 Other psychoactive substance dependence, uncomplicated: Secondary | ICD-10-CM

## 2013-08-05 DIAGNOSIS — O30009 Twin pregnancy, unspecified number of placenta and unspecified number of amniotic sacs, unspecified trimester: Secondary | ICD-10-CM

## 2013-08-05 DIAGNOSIS — O0992 Supervision of high risk pregnancy, unspecified, second trimester: Secondary | ICD-10-CM

## 2013-08-05 DIAGNOSIS — Z1389 Encounter for screening for other disorder: Secondary | ICD-10-CM

## 2013-08-05 DIAGNOSIS — F1290 Cannabis use, unspecified, uncomplicated: Secondary | ICD-10-CM

## 2013-08-05 DIAGNOSIS — Z331 Pregnant state, incidental: Secondary | ICD-10-CM

## 2013-08-05 LAB — POCT URINALYSIS DIPSTICK
Leukocytes, UA: NEGATIVE
Nitrite, UA: NEGATIVE
Protein, UA: NEGATIVE

## 2013-08-05 NOTE — Patient Instructions (Signed)
Pregnancy - Second Trimester The second trimester of pregnancy (3 to 6 months) is a period of rapid growth for you and your baby. At the end of the sixth month, your baby is about 9 inches long and weighs 1 1/2 pounds. You will begin to feel the baby move between 18 and 20 weeks of the pregnancy. This is called quickening. Weight gain is faster. A clear fluid (colostrum) may leak out of your breasts. You may feel small contractions of the womb (uterus). This is known as false labor or Braxton-Hicks contractions. This is like a practice for labor when the baby is ready to be born. Usually, the problems with morning sickness have usually passed by the end of your first trimester. Some women develop small dark blotches (called cholasma, mask of pregnancy) on their face that usually goes away after the baby is born. Exposure to the sun makes the blotches worse. Acne may also develop in some pregnant women and pregnant women who have acne, may find that it goes away. PRENATAL EXAMS  Blood work may continue to be done during prenatal exams. These tests are done to check on your health and the probable health of your baby. Blood work is used to follow your blood levels (hemoglobin). Anemia (low hemoglobin) is common during pregnancy. Iron and vitamins are given to help prevent this. You will also be checked for diabetes between 24 and 28 weeks of the pregnancy. Some of the previous blood tests may be repeated.  The size of the uterus is measured during each visit. This is to make sure that the baby is continuing to grow properly according to the dates of the pregnancy.  Your blood pressure is checked every prenatal visit. This is to make sure you are not getting toxemia.  Your urine is checked to make sure you do not have an infection, diabetes or protein in the urine.  Your weight is checked often to make sure gains are happening at the suggested rate. This is to ensure that both you and your baby are  growing normally.  Sometimes, an ultrasound is performed to confirm the proper growth and development of the baby. This is a test which bounces harmless sound waves off the baby so your caregiver can more accurately determine due dates. Sometimes, a test is done on the amniotic fluid surrounding the baby. This test is called an amniocentesis. The amniotic fluid is obtained by sticking a needle into the belly (abdomen). This is done to check the chromosomes in instances where there is a concern about possible genetic problems with the baby. It is also sometimes done near the end of pregnancy if an early delivery is required. In this case, it is done to help make sure the baby's lungs are mature enough for the baby to live outside of the womb. CHANGES OCCURING IN THE SECOND TRIMESTER OF PREGNANCY Your body goes through many changes during pregnancy. They vary from person to person. Talk to your caregiver about changes you notice that you are concerned about.  During the second trimester, you will likely have an increase in your appetite. It is normal to have cravings for certain foods. This varies from person to person and pregnancy to pregnancy.  Your lower abdomen will begin to bulge.  You may have to urinate more often because the uterus and baby are pressing on your bladder. It is also common to get more bladder infections during pregnancy. You can help this by drinking lots of fluids   and emptying your bladder before and after intercourse.  You may begin to get stretch marks on your hips, abdomen, and breasts. These are normal changes in the body during pregnancy. There are no exercises or medicines to take that prevent this change.  You may begin to develop swollen and bulging veins (varicose veins) in your legs. Wearing support hose, elevating your feet for 15 minutes, 3 to 4 times a day and limiting salt in your diet helps lessen the problem.  Heartburn may develop as the uterus grows and  pushes up against the stomach. Antacids recommended by your caregiver helps with this problem. Also, eating smaller meals 4 to 5 times a day helps.  Constipation can be treated with a stool softener or adding bulk to your diet. Drinking lots of fluids, and eating vegetables, fruits, and whole grains are helpful.  Exercising is also helpful. If you have been very active up until your pregnancy, most of these activities can be continued during your pregnancy. If you have been less active, it is helpful to start an exercise program such as walking.  Hemorrhoids may develop at the end of the second trimester. Warm sitz baths and hemorrhoid cream recommended by your caregiver helps hemorrhoid problems.  Backaches may develop during this time of your pregnancy. Avoid heavy lifting, wear low heal shoes, and practice good posture to help with backache problems.  Some pregnant women develop tingling and numbness of their hand and fingers because of swelling and tightening of ligaments in the wrist (carpel tunnel syndrome). This goes away after the baby is born.  As your breasts enlarge, you may have to get a bigger bra. Get a comfortable, cotton, support bra. Do not get a nursing bra until the last month of the pregnancy if you will be nursing the baby.  You may get a dark line from your belly button to the pubic area called the linea nigra.  You may develop rosy cheeks because of increase blood flow to the face.  You may develop spider looking lines of the face, neck, arms, and chest. These go away after the baby is born. HOME CARE INSTRUCTIONS   It is extremely important to avoid all smoking, herbs, alcohol, and unprescribed drugs during your pregnancy. These chemicals affect the formation and growth of the baby. Avoid these chemicals throughout the pregnancy to ensure the delivery of a healthy infant.  Most of your home care instructions are the same as suggested for the first trimester of your  pregnancy. Keep your caregiver's appointments. Follow your caregiver's instructions regarding medicine use, exercise, and diet.  During pregnancy, you are providing food for you and your baby. Continue to eat regular, well-balanced meals. Choose foods such as meat, fish, milk and other low fat dairy products, vegetables, fruits, and whole-grain breads and cereals. Your caregiver will tell you of the ideal weight gain.  A physical sexual relationship may be continued up until near the end of pregnancy if there are no other problems. Problems could include early (premature) leaking of amniotic fluid from the membranes, vaginal bleeding, abdominal pain, or other medical or pregnancy problems.  Exercise regularly if there are no restrictions. Check with your caregiver if you are unsure of the safety of some of your exercises. The greatest weight gain will occur in the last 2 trimesters of pregnancy. Exercise will help you:  Control your weight.  Get you in shape for labor and delivery.  Lose weight after you have the baby.  Wear   a good support or jogging bra for breast tenderness during pregnancy. This may help if worn during sleep. Pads or tissues may be used in the bra if you are leaking colostrum.  Do not use hot tubs, steam rooms or saunas throughout the pregnancy.  Wear your seat belt at all times when driving. This protects you and your baby if you are in an accident.  Avoid raw meat, uncooked cheese, cat litter boxes, and soil used by cats. These carry germs that can cause birth defects in the baby.  The second trimester is also a good time to visit your dentist for your dental health if this has not been done yet. Getting your teeth cleaned is okay. Use a soft toothbrush. Brush gently during pregnancy.  It is easier to leak urine during pregnancy. Tightening up and strengthening the pelvic muscles will help with this problem. Practice stopping your urination while you are going to the  bathroom. These are the same muscles you need to strengthen. It is also the muscles you would use as if you were trying to stop from passing gas. You can practice tightening these muscles up 10 times a set and repeating this about 3 times per day. Once you know what muscles to tighten up, do not perform these exercises during urination. It is more likely to contribute to an infection by backing up the urine.  Ask for help if you have financial, counseling, or nutritional needs during pregnancy. Your caregiver will be able to offer counseling for these needs as well as refer you for other special needs.  Your skin may become oily. If so, wash your face with mild soap, use non-greasy moisturizer and oil or cream based makeup. MEDICINES AND DRUG USE IN PREGNANCY  Take prenatal vitamins as directed. The vitamin should contain 1 milligram of folic acid. Keep all vitamins out of reach of children. Only a couple vitamins or tablets containing iron may be fatal to a baby or young child when ingested.  Avoid use of all medicines, including herbs, over-the-counter medicines, not prescribed or suggested by your caregiver. Only take over-the-counter or prescription medicines for pain, discomfort, or fever as directed by your caregiver. Do not use aspirin.  Let your caregiver also know about herbs you may be using.  Alcohol is related to a number of birth defects. This includes fetal alcohol syndrome. All alcohol, in any form, should be avoided completely. Smoking will cause low birth rate and premature babies.  Street or illegal drugs are very harmful to the baby. They are absolutely forbidden. A baby born to an addicted mother will be addicted at birth. The baby will go through the same withdrawal an adult does. SEEK MEDICAL CARE IF:  You have any concerns or worries during your pregnancy. It is better to call with your questions if you feel they cannot wait, rather than worry about them. SEEK IMMEDIATE  MEDICAL CARE IF:   An unexplained oral temperature above 102 F (38.9 C) develops, or as your caregiver suggests.  You have leaking of fluid from the vagina (birth canal). If leaking membranes are suspected, take your temperature and tell your caregiver of this when you call.  There is vaginal spotting, bleeding, or passing clots. Tell your caregiver of the amount and how many pads are used. Light spotting in pregnancy is common, especially following intercourse.  You develop a bad smelling vaginal discharge with a change in the color from clear to white.  You continue to feel   sick to your stomach (nauseated) and have no relief from remedies suggested. You vomit blood or coffee ground-like materials.  You lose more than 2 pounds of weight or gain more than 2 pounds of weight over 1 week, or as suggested by your caregiver.  You notice swelling of your face, hands, feet, or legs.  You get exposed to German measles and have never had them.  You are exposed to fifth disease or chickenpox.  You develop belly (abdominal) pain. Round ligament discomfort is a common non-cancerous (benign) cause of abdominal pain in pregnancy. Your caregiver still must evaluate you.  You develop a bad headache that does not go away.  You develop fever, diarrhea, pain with urination, or shortness of breath.  You develop visual problems, blurry, or double vision.  You fall or are in a car accident or any kind of trauma.  There is mental or physical violence at home. Document Released: 09/19/2001 Document Revised: 06/19/2012 Document Reviewed: 03/24/2009 [employer] Patient Information 2014 [employer].  

## 2013-08-05 NOTE — Progress Notes (Signed)
Reports good fm x 2. Denies uc's, lof, vb, urinary frequency, urgency, hesitancy, or dysuria.  No complaints.  Reviewed ptl s/s, fm.  All questions answered. F/U in 1wk for twin growth u/s and visit.

## 2013-08-13 ENCOUNTER — Encounter: Payer: Self-pay | Admitting: Advanced Practice Midwife

## 2013-08-13 ENCOUNTER — Other Ambulatory Visit: Payer: Self-pay | Admitting: Obstetrics & Gynecology

## 2013-08-13 ENCOUNTER — Other Ambulatory Visit: Payer: Self-pay | Admitting: Women's Health

## 2013-08-13 ENCOUNTER — Ambulatory Visit (INDEPENDENT_AMBULATORY_CARE_PROVIDER_SITE_OTHER): Payer: Medicaid Other

## 2013-08-13 ENCOUNTER — Ambulatory Visit (INDEPENDENT_AMBULATORY_CARE_PROVIDER_SITE_OTHER): Payer: Medicaid Other | Admitting: Advanced Practice Midwife

## 2013-08-13 VITALS — BP 108/68 | Wt 240.5 lb

## 2013-08-13 DIAGNOSIS — F1920 Other psychoactive substance dependence, uncomplicated: Secondary | ICD-10-CM

## 2013-08-13 DIAGNOSIS — O30009 Twin pregnancy, unspecified number of placenta and unspecified number of amniotic sacs, unspecified trimester: Secondary | ICD-10-CM

## 2013-08-13 DIAGNOSIS — Z1389 Encounter for screening for other disorder: Secondary | ICD-10-CM

## 2013-08-13 DIAGNOSIS — O09899 Supervision of other high risk pregnancies, unspecified trimester: Secondary | ICD-10-CM

## 2013-08-13 DIAGNOSIS — O3092 Multiple gestation, unspecified, second trimester: Secondary | ICD-10-CM

## 2013-08-13 DIAGNOSIS — O0992 Supervision of high risk pregnancy, unspecified, second trimester: Secondary | ICD-10-CM

## 2013-08-13 DIAGNOSIS — Z331 Pregnant state, incidental: Secondary | ICD-10-CM

## 2013-08-13 LAB — POCT URINALYSIS DIPSTICK
Blood, UA: NEGATIVE
Glucose, UA: NEGATIVE
Ketones, UA: NEGATIVE

## 2013-08-13 NOTE — Patient Instructions (Signed)
1. Before your test, do not eat or drink anything for 8-10 hours prior to your  appointment (a small amount of water is allowed and you may take any medicines you normally take). 2. When you arrive, your blood will be drawn for a 'fasting' blood sugar level.  Then you will be given a sweetened carbonated beverage to drink. You should  complete drinking this beverage within five minutes. After finishing the  beverage, you will have your blood drawn exactly 1 and 2 hours later. Having  your blood drawn on time is an important part of this test. A total of three blood  samples will be done. 3. The test takes approximately 2  hours. During the test, do not have anything to  eat or drink. Do not smoke, chew gum (not even sugarless gum) or use breath mints.  4. During the test you should remain close by and seated as much as possible and  avoid walking around. You may want to bring a book or something else to  occupy your time.  5. After your test, you may eat and drink as normal. You may want to bring a snack  to eat after the test is finished. Your provider will advise you as to the results of  this test and any follow-up if necessary  

## 2013-08-13 NOTE — Progress Notes (Signed)
U/S(24+2wks)- twin UP, DI-DI, All posterior gr 1 placenta, cx long and closed (3.5cm), EFW Discordance - 1% Baby A- VTX, active fetus, appropriate growth EFW 1 lb 11 oz (59th%tile), fluid wnl single deepest pocket=4.4cm, FHR-138 bpm, female fetus Baby B- Breech active fetus, appropriate growth EFW 1 lb 10 oz(57th%tile), fluid WNL single deepest pocket=5.2cm, FHR-148 bpm, female fetus

## 2013-08-13 NOTE — Progress Notes (Signed)
Had u/s.  Growth concordant, fluid normal X2.    No c/o at this time.  Routine questions about pregnancy answered.  F/U in 3.5 weeks growth u/s, PN2.

## 2013-09-08 ENCOUNTER — Ambulatory Visit (INDEPENDENT_AMBULATORY_CARE_PROVIDER_SITE_OTHER): Payer: Medicaid Other | Admitting: Women's Health

## 2013-09-08 ENCOUNTER — Ambulatory Visit (INDEPENDENT_AMBULATORY_CARE_PROVIDER_SITE_OTHER): Payer: Medicaid Other

## 2013-09-08 ENCOUNTER — Other Ambulatory Visit: Payer: Self-pay | Admitting: Obstetrics & Gynecology

## 2013-09-08 ENCOUNTER — Other Ambulatory Visit (INDEPENDENT_AMBULATORY_CARE_PROVIDER_SITE_OTHER): Payer: Medicaid Other

## 2013-09-08 ENCOUNTER — Encounter: Payer: Self-pay | Admitting: Women's Health

## 2013-09-08 VITALS — BP 108/74 | Wt 241.0 lb

## 2013-09-08 DIAGNOSIS — Z1389 Encounter for screening for other disorder: Secondary | ICD-10-CM

## 2013-09-08 DIAGNOSIS — O0993 Supervision of high risk pregnancy, unspecified, third trimester: Secondary | ICD-10-CM

## 2013-09-08 DIAGNOSIS — F1920 Other psychoactive substance dependence, uncomplicated: Secondary | ICD-10-CM

## 2013-09-08 DIAGNOSIS — O30009 Twin pregnancy, unspecified number of placenta and unspecified number of amniotic sacs, unspecified trimester: Secondary | ICD-10-CM

## 2013-09-08 DIAGNOSIS — Z331 Pregnant state, incidental: Secondary | ICD-10-CM

## 2013-09-08 DIAGNOSIS — O3092 Multiple gestation, unspecified, second trimester: Secondary | ICD-10-CM

## 2013-09-08 DIAGNOSIS — Z3480 Encounter for supervision of other normal pregnancy, unspecified trimester: Secondary | ICD-10-CM

## 2013-09-08 LAB — CBC
Hemoglobin: 11.2 g/dL — ABNORMAL LOW (ref 12.0–15.0)
MCH: 29.3 pg (ref 26.0–34.0)
MCV: 86.1 fL (ref 78.0–100.0)
RBC: 3.82 MIL/uL — ABNORMAL LOW (ref 3.87–5.11)

## 2013-09-08 LAB — POCT URINALYSIS DIPSTICK
Leukocytes, UA: NEGATIVE
Nitrite, UA: NEGATIVE
Protein, UA: NEGATIVE

## 2013-09-08 NOTE — Patient Instructions (Signed)
Preterm Labor Information °Preterm labor is when labor starts at less than 37 weeks of pregnancy. The normal length of a pregnancy is 39 to 41 weeks. °CAUSES °Often, there is no identifiable underlying cause as to why a woman goes into preterm labor. One of the most common known causes of preterm labor is infection. Infections of the uterus, cervix, vagina, amniotic sac, bladder, kidney, or even the lungs (pneumonia) can cause labor to start. Other suspected causes of preterm labor include:  °· Urogenital infections, such as yeast infections and bacterial vaginosis.   °· Uterine abnormalities (uterine shape, uterine septum, fibroids, or bleeding from the placenta).   °· A cervix that has been operated on (it may fail to stay closed).   °· Malformations in the fetus.   °· Multiple gestations (twins, triplets, and so on).   °· Breakage of the amniotic sac.   °RISK FACTORS °· Having a previous history of preterm labor.   °· Having premature rupture of membranes (PROM).   °· Having a placenta that covers the opening of the cervix (placenta previa).   °· Having a placenta that separates from the uterus (placental abruption).   °· Having a cervix that is too weak to hold the fetus in the uterus (incompetent cervix).   °· Having too much fluid in the amniotic sac (polyhydramnios).   °· Taking illegal drugs or smoking while pregnant.   °· Not gaining enough weight while pregnant.   °· Being younger than 18 and older than 24 years old.   °· Having a low socioeconomic status.   °· Being African American. °SYMPTOMS °Signs and symptoms of preterm labor include:  °· Menstrual-like cramps, abdominal pain, or back pain. °· Uterine contractions that are regular, as frequent as six in an hour, regardless of their intensity (may be mild or painful). °· Contractions that start on the top of the uterus and spread down to the lower abdomen and back.   °· A sense of increased pelvic pressure.   °· A watery or bloody mucus discharge that  comes from the vagina.   °TREATMENT °Depending on the length of the pregnancy and other circumstances, your health care provider may suggest bed rest. If necessary, there are medicines that can be given to stop contractions and to mature the fetal lungs. If labor happens before 34 weeks of pregnancy, a prolonged hospital stay may be recommended. Treatment depends on the condition of both you and the fetus.  °WHAT SHOULD YOU DO IF YOU THINK YOU ARE IN PRETERM LABOR? °Call your health care provider right away. You will need to go to the hospital to get checked immediately. °HOW CAN YOU PREVENT PRETERM LABOR IN FUTURE PREGNANCIES? °You should:  °· Stop smoking if you smoke.  °· Maintain healthy weight gain and avoid chemicals and drugs that are not necessary. °· Be watchful for any type of infection. °· Inform your health care provider if you have a known history of preterm labor. °Document Released: 12/16/2003 Document Revised: 05/28/2013 Document Reviewed: 10/28/2012 °ExitCare® Patient Information ©2014 [employer]. ° °Third Trimester of Pregnancy °The third trimester is from week 29 through week 42, months 7 through 9. The third trimester is a time when the fetus is growing rapidly. At the end of the ninth month, the fetus is about 20 inches in length and weighs 6 10 pounds.  °BODY CHANGES °Your body goes through many changes during pregnancy. The changes vary from woman to woman.  °· Your weight will continue to increase. You can expect to gain 25 35 pounds (11 16 kg) by the end of the pregnancy. °·   You may begin to get stretch marks on your hips, abdomen, and breasts. °· You may urinate more often because the fetus is moving lower into your pelvis and pressing on your bladder. °· You may develop or continue to have heartburn as a result of your pregnancy. °· You may develop constipation because certain hormones are causing the muscles that push waste through your intestines to slow down. °· You may develop  hemorrhoids or swollen, bulging veins (varicose veins). °· You may have pelvic pain because of the weight gain and pregnancy hormones relaxing your joints between the bones in your pelvis. Back aches may result from over exertion of the muscles supporting your posture. °· Your breasts will continue to grow and be tender. A yellow discharge may leak from your breasts called colostrum. °· Your belly button may stick out. °· You may feel short of breath because of your expanding uterus. °· You may notice the fetus "dropping," or moving lower in your abdomen. °· You may have a bloody mucus discharge. This usually occurs a few days to a week before labor begins. °· Your cervix becomes thin and soft (effaced) near your due date. °WHAT TO EXPECT AT YOUR PRENATAL EXAMS  °You will have prenatal exams every 2 weeks until week 36. Then, you will have weekly prenatal exams. During a routine prenatal visit: °· You will be weighed to make sure you and the fetus are growing normally. °· Your blood pressure is taken. °· Your abdomen will be measured to track your baby's growth. °· The fetal heartbeat will be listened to. °· Any test results from the previous visit will be discussed. °· You may have a cervical check near your due date to see if you have effaced. °At around 36 weeks, your caregiver will check your cervix. At the same time, your caregiver will also perform a test on the secretions of the vaginal tissue. This test is to determine if a type of bacteria, Group B streptococcus, is present. Your caregiver will explain this further. °Your caregiver may ask you: °· What your birth plan is. °· How you are feeling. °· If you are feeling the baby move. °· If you have had any abnormal symptoms, such as leaking fluid, bleeding, severe headaches, or abdominal cramping. °· If you have any questions. °Other tests or screenings that may be performed during your third trimester include: °· Blood tests that check for low iron levels  (anemia). °· Fetal testing to check the health, activity level, and growth of the fetus. Testing is done if you have certain medical conditions or if there are problems during the pregnancy. °FALSE LABOR °You may feel small, irregular contractions that eventually go away. These are called Braxton Hicks contractions, or false labor. Contractions may last for hours, days, or even weeks before true labor sets in. If contractions come at regular intervals, intensify, or become painful, it is best to be seen by your caregiver.  °SIGNS OF LABOR  °· Menstrual-like cramps. °· Contractions that are 5 minutes apart or less. °· Contractions that start on the top of the uterus and spread down to the lower abdomen and back. °· A sense of increased pelvic pressure or back pain. °· A watery or bloody mucus discharge that comes from the vagina. °If you have any of these signs before the 37th week of pregnancy, call your caregiver right away. You need to go to the hospital to get checked immediately. °HOME CARE INSTRUCTIONS  °· Avoid all   smoking, herbs, alcohol, and unprescribed drugs. These chemicals affect the formation and growth of the baby. °· Follow your caregiver's instructions regarding medicine use. There are medicines that are either safe or unsafe to take during pregnancy. °· Exercise only as directed by your caregiver. Experiencing uterine cramps is a good sign to stop exercising. °· Continue to eat regular, healthy meals. °· Wear a good support bra for breast tenderness. °· Do not use hot tubs, steam rooms, or saunas. °· Wear your seat belt at all times when driving. °· Avoid raw meat, uncooked cheese, cat litter boxes, and soil used by cats. These carry germs that can cause birth defects in the baby. °· Take your prenatal vitamins. °· Try taking a stool softener (if your caregiver approves) if you develop constipation. Eat more high-fiber foods, such as fresh vegetables or fruit and whole grains. Drink plenty of fluids  to keep your urine clear or pale yellow. °· Take warm sitz baths to soothe any pain or discomfort caused by hemorrhoids. Use hemorrhoid cream if your caregiver approves. °· If you develop varicose veins, wear support hose. Elevate your feet for 15 minutes, 3 4 times a day. Limit salt in your diet. °· Avoid heavy lifting, wear low heal shoes, and practice good posture. °· Rest a lot with your legs elevated if you have leg cramps or low back pain. °· Visit your dentist if you have not gone during your pregnancy. Use a soft toothbrush to brush your teeth and be gentle when you floss. °· A sexual relationship may be continued unless your caregiver directs you otherwise. °· Do not travel far distances unless it is absolutely necessary and only with the approval of your caregiver. °· Take prenatal classes to understand, practice, and ask questions about the labor and delivery. °· Make a trial run to the hospital. °· Pack your hospital bag. °· Prepare the baby's nursery. °· Continue to go to all your prenatal visits as directed by your caregiver. °SEEK MEDICAL CARE IF: °· You are unsure if you are in labor or if your water has broken. °· You have dizziness. °· You have mild pelvic cramps, pelvic pressure, or nagging pain in your abdominal area. °· You have persistent nausea, vomiting, or diarrhea. °· You have a bad smelling vaginal discharge. °· You have pain with urination. °SEEK IMMEDIATE MEDICAL CARE IF:  °· You have a fever. °· You are leaking fluid from your vagina. °· You have spotting or bleeding from your vagina. °· You have severe abdominal cramping or pain. °· You have rapid weight loss or gain. °· You have shortness of breath with chest pain. °· You notice sudden or extreme swelling of your face, hands, ankles, feet, or legs. °· You have not felt your baby move in over an hour. °· You have severe headaches that do not go away with medicine. °· You have vision changes. °Document Released: 09/19/2001 Document  Revised: 05/28/2013 Document Reviewed: 11/26/2012 °ExitCare® Patient Information ©2014 [employer]. ° °

## 2013-09-08 NOTE — Progress Notes (Addendum)
U/S(28+0wks)-twin IUP di/di, Baby A-maternal Lt side, Baby B-maternal Rt side, + membrane noted, all posterior Gr 1 placenta, cx-3.8cm closed, EFW discordance-2% Baby A- frank breech, active fetus, approp growth EFW 2 lb 9 oz (51st%tile), fluid WNL single deepest pocket=6.2cm, FHR- 138 bpm, female fetus Baby B-vtx active fetus, approp growth EFW 2 lb 8 oz (48%Tile), fluid WNL single deepest pocket=5.2cm, FHR-144 bpm, female fetus

## 2013-09-08 NOTE — Progress Notes (Signed)
Reports good fm. Denies uc's, lof, vb, urinary frequency, urgency, hesitancy, or dysuria.  No complaints.  Reviewed u/s results from today, ptl s/s, fkc.  All questions answered. F/U in 4wks for u/s, visit.  PN2 today.

## 2013-09-09 ENCOUNTER — Encounter: Payer: Self-pay | Admitting: Women's Health

## 2013-09-09 ENCOUNTER — Encounter: Payer: Medicaid Other | Admitting: Women's Health

## 2013-09-09 DIAGNOSIS — R768 Other specified abnormal immunological findings in serum: Secondary | ICD-10-CM | POA: Insufficient documentation

## 2013-09-09 LAB — HSV 2 ANTIBODY, IGG: HSV 2 Glycoprotein G Ab, IgG: 10.45 IV — ABNORMAL HIGH

## 2013-09-09 LAB — GLUCOSE TOLERANCE, 2 HOURS W/ 1HR
Glucose, 1 hour: 103 mg/dL (ref 70–170)
Glucose, 2 hour: 90 mg/dL (ref 70–139)
Glucose, Fasting: 73 mg/dL (ref 70–99)

## 2013-09-09 LAB — HIV ANTIBODY (ROUTINE TESTING W REFLEX): HIV: NONREACTIVE

## 2013-09-09 LAB — ANTIBODY SCREEN: Antibody Screen: NEGATIVE

## 2013-10-06 ENCOUNTER — Other Ambulatory Visit: Payer: Self-pay | Admitting: Women's Health

## 2013-10-06 ENCOUNTER — Other Ambulatory Visit: Payer: Self-pay | Admitting: Obstetrics & Gynecology

## 2013-10-06 ENCOUNTER — Ambulatory Visit (INDEPENDENT_AMBULATORY_CARE_PROVIDER_SITE_OTHER): Payer: Medicaid Other

## 2013-10-06 ENCOUNTER — Ambulatory Visit (INDEPENDENT_AMBULATORY_CARE_PROVIDER_SITE_OTHER): Payer: Medicaid Other | Admitting: Obstetrics and Gynecology

## 2013-10-06 VITALS — BP 112/70 | Wt 246.4 lb

## 2013-10-06 DIAGNOSIS — O30009 Twin pregnancy, unspecified number of placenta and unspecified number of amniotic sacs, unspecified trimester: Secondary | ICD-10-CM

## 2013-10-06 DIAGNOSIS — F1920 Other psychoactive substance dependence, uncomplicated: Secondary | ICD-10-CM

## 2013-10-06 DIAGNOSIS — Z1389 Encounter for screening for other disorder: Secondary | ICD-10-CM

## 2013-10-06 DIAGNOSIS — O30003 Twin pregnancy, unspecified number of placenta and unspecified number of amniotic sacs, third trimester: Secondary | ICD-10-CM

## 2013-10-06 DIAGNOSIS — O09899 Supervision of other high risk pregnancies, unspecified trimester: Secondary | ICD-10-CM

## 2013-10-06 DIAGNOSIS — O0993 Supervision of high risk pregnancy, unspecified, third trimester: Secondary | ICD-10-CM

## 2013-10-06 DIAGNOSIS — O98519 Other viral diseases complicating pregnancy, unspecified trimester: Secondary | ICD-10-CM

## 2013-10-06 DIAGNOSIS — O99019 Anemia complicating pregnancy, unspecified trimester: Secondary | ICD-10-CM

## 2013-10-06 DIAGNOSIS — Z331 Pregnant state, incidental: Secondary | ICD-10-CM

## 2013-10-06 LAB — POCT URINALYSIS DIPSTICK
Blood, UA: NEGATIVE
Glucose, UA: NEGATIVE
Ketones, UA: NEGATIVE
Leukocytes, UA: NEGATIVE
Protein, UA: NEGATIVE

## 2013-10-06 NOTE — Progress Notes (Signed)
U/s twins with symmetric growth, (53, 57%), vertex/vertex . BPP 8/8 x2  Good FM.  Imp routine care. F/u 2wk then wkly

## 2013-10-06 NOTE — Progress Notes (Addendum)
U/S(32+0wks)-Twin DI/DI , +membrane noted, EFW discordance-3% Baby A-(maternal Lt side)-vtx active fetus, fluid wnl single deepest pocket-3.3cm, EFW 4 lb 7 oz (57th%tile), FHR-147bpm, posterior Gr 1 placenta noted, female fetus, BPP 8/8 Baby B-(maternal Rt side)-vtx active fetus, fluid wnl single deepest podket-6.4cm, EFW 4 lb 5 oz (53rd%tile), FHR-141bpm, posterior fundal Gr 2 placenta noted, female fetus, BPP8/8

## 2013-10-06 NOTE — Patient Instructions (Signed)
Fetal Movement Counts Patient Name: __________________________________________________ Patient Due Date: ____________________ Performing a fetal movement count is highly recommended in high-risk pregnancies, but it is good for every pregnant woman to do. Your caregiver may ask you to start counting fetal movements at 28 weeks of the pregnancy. Fetal movements often increase:  After eating a full meal.  After physical activity.  After eating or drinking something sweet or cold.  At rest. Pay attention to when you feel the baby is most active. This will help you notice a pattern of your baby's sleep and wake cycles and what factors contribute to an increase in fetal movement. It is important to perform a fetal movement count at the same time each day when your baby is normally most active.  HOW TO COUNT FETAL MOVEMENTS 1. Find a quiet and comfortable area to sit or lie down on your left side. Lying on your left side provides the best blood and oxygen circulation to your baby. 2. Write down the day and time on a sheet of paper or in a journal. 3. Start counting kicks, flutters, swishes, rolls, or jabs in a 2 hour period. You should feel at least 10 movements within 2 hours. 4. If you do not feel 10 movements in 2 hours, wait 2 3 hours and count again. Look for a change in the pattern or not enough counts in 2 hours. SEEK MEDICAL CARE IF:  You feel less than 10 counts in 2 hours, tried twice.  There is no movement in over an hour.  The pattern is changing or taking longer each day to reach 10 counts in 2 hours.  You feel the baby is not moving as he or she usually does. Date: ____________ Movements: ____________ Start time: ____________ Finish time: ____________  Date: ____________ Movements: ____________ Start time: ____________ Finish time: ____________ Date: ____________ Movements: ____________ Start time: ____________ Finish time: ____________ Date: ____________ Movements: ____________  Start time: ____________ Finish time: ____________ Date: ____________ Movements: ____________ Start time: ____________ Finish time: ____________ Date: ____________ Movements: ____________ Start time: ____________ Finish time: ____________ Date: ____________ Movements: ____________ Start time: ____________ Finish time: ____________ Date: ____________ Movements: ____________ Start time: ____________ Finish time: ____________  Date: ____________ Movements: ____________ Start time: ____________ Finish time: ____________ Date: ____________ Movements: ____________ Start time: ____________ Finish time: ____________ Date: ____________ Movements: ____________ Start time: ____________ Finish time: ____________ Date: ____________ Movements: ____________ Start time: ____________ Finish time: ____________ Date: ____________ Movements: ____________ Start time: ____________ Finish time: ____________ Date: ____________ Movements: ____________ Start time: ____________ Finish time: ____________ Date: ____________ Movements: ____________ Start time: ____________ Finish time: ____________  Date: ____________ Movements: ____________ Start time: ____________ Finish time: ____________ Date: ____________ Movements: ____________ Start time: ____________ Finish time: ____________ Date: ____________ Movements: ____________ Start time: ____________ Finish time: ____________ Date: ____________ Movements: ____________ Start time: ____________ Finish time: ____________ Date: ____________ Movements: ____________ Start time: ____________ Finish time: ____________ Date: ____________ Movements: ____________ Start time: ____________ Finish time: ____________ Date: ____________ Movements: ____________ Start time: ____________ Finish time: ____________  Date: ____________ Movements: ____________ Start time: ____________ Finish time: ____________ Date: ____________ Movements: ____________ Start time: ____________ Finish time:  ____________ Date: ____________ Movements: ____________ Start time: ____________ Finish time: ____________ Date: ____________ Movements: ____________ Start time: ____________ Finish time: ____________ Date: ____________ Movements: ____________ Start time: ____________ Finish time: ____________ Date: ____________ Movements: ____________ Start time: ____________ Finish time: ____________ Date: ____________ Movements: ____________ Start time: ____________ Finish time: ____________  Date: ____________ Movements: ____________ Start time: ____________ Finish   time: ____________ Date: ____________ Movements: ____________ Start time: ____________ Finish time: ____________ Date: ____________ Movements: ____________ Start time: ____________ Finish time: ____________ Date: ____________ Movements: ____________ Start time: ____________ Finish time: ____________ Date: ____________ Movements: ____________ Start time: ____________ Finish time: ____________ Date: ____________ Movements: ____________ Start time: ____________ Finish time: ____________ Date: ____________ Movements: ____________ Start time: ____________ Finish time: ____________  Date: ____________ Movements: ____________ Start time: ____________ Finish time: ____________ Date: ____________ Movements: ____________ Start time: ____________ Finish time: ____________ Date: ____________ Movements: ____________ Start time: ____________ Finish time: ____________ Date: ____________ Movements: ____________ Start time: ____________ Finish time: ____________ Date: ____________ Movements: ____________ Start time: ____________ Finish time: ____________ Date: ____________ Movements: ____________ Start time: ____________ Finish time: ____________ Date: ____________ Movements: ____________ Start time: ____________ Finish time: ____________  Date: ____________ Movements: ____________ Start time: ____________ Finish time: ____________ Date: ____________ Movements:  ____________ Start time: ____________ Finish time: ____________ Date: ____________ Movements: ____________ Start time: ____________ Finish time: ____________ Date: ____________ Movements: ____________ Start time: ____________ Finish time: ____________ Date: ____________ Movements: ____________ Start time: ____________ Finish time: ____________ Date: ____________ Movements: ____________ Start time: ____________ Finish time: ____________ Date: ____________ Movements: ____________ Start time: ____________ Finish time: ____________  Date: ____________ Movements: ____________ Start time: ____________ Finish time: ____________ Date: ____________ Movements: ____________ Start time: ____________ Finish time: ____________ Date: ____________ Movements: ____________ Start time: ____________ Finish time: ____________ Date: ____________ Movements: ____________ Start time: ____________ Finish time: ____________ Date: ____________ Movements: ____________ Start time: ____________ Finish time: ____________ Date: ____________ Movements: ____________ Start time: ____________ Finish time: ____________ Document Released: 10/25/2006 Document Revised: 09/11/2012 Document Reviewed: 07/22/2012 Exit[employer] Patient Information 2014 [employer].  

## 2013-10-09 NOTE — L&D Delivery Note (Signed)
  Ms. Cindy Sosa pushed with good maternal effort to deliver 2 healthy baby boys approximately 50 minutes apart.  Both infants with spontaneous cry and good tone.  After baby boy A, boy B was confirmed to be vertex by bedside US. Placenta with two cords delivered with minimal traction shortly after Baby boy B.  Uterine massage, pitocin and cytotec given for postpartum bleeding.    All counts correct.  Myself, Dorathy KinsmanVirginia Smith CNM, Dr. Penne LashLeggett MD were present for delivery of both infants.   Mom and Babies are doing well.     Cindy Sosa, BoyA Cindy Sosa [[id number]]  Delivery Note At 11:30 PM a viable and healthy female was delivered via Vaginal, Spontaneous Delivery (Occiput Posterior presentation).  APGAR: 8, 9; weight 6 lb 10 oz (3005 g).    Placenta delivered intact after BabyBoyB delivered.  3V cord.  Anesthesia: Epidural  Episiotomy: None Lacerations: 2nd Degree perineal tear + Right labial tear Suture Repair: 3.0 vicryl rapide Est. Blood Loss (mL): 450  Mom to postpartum.  Baby to Couplet care / Skin to Skin.  [name], [name] 11/19/2013, 3:35 AM     Cindy Sosa, BoyB Cindy Sosa [[id number]]  Delivery Note At 12:18 AM a viable and healthy female was delivered via Vaginal, Spontaneous Delivery (Presentation: Occiput Posterior).  APGAR: 8, 9; weight 6 lb 4.4 oz (2845 g).   Placenta status: Intact, Spontaneous.  Cord: 3 vessels with the following complications: None.   Anesthesia: Epidural  Episiotomy: None Lacerations: 2nd Degree perineal tear + Righ Labial tear Suture Repair: 3.0 vicryl rapide Est. Blood Loss (mL):  450  Mom to postpartum.  Baby to Couplet care / Skin to Skin.  [name], [name] 11/19/2013, 3:35 AM

## 2013-10-09 NOTE — L&D Delivery Note (Signed)
I was present for the entire delivery and repair and agree with above. Velamentous insertion of baby A's cord through dividing membrane.   Churchill[place]ia Treacy Holcomb, [place] 11/19/2013 6:33 AM

## 2013-10-13 ENCOUNTER — Encounter: Payer: Self-pay | Admitting: Women's Health

## 2013-10-20 ENCOUNTER — Ambulatory Visit (INDEPENDENT_AMBULATORY_CARE_PROVIDER_SITE_OTHER): Payer: Medicaid Other | Admitting: Obstetrics & Gynecology

## 2013-10-20 ENCOUNTER — Encounter: Payer: Self-pay | Admitting: Obstetrics & Gynecology

## 2013-10-20 VITALS — BP 100/80 | Wt 245.0 lb

## 2013-10-20 DIAGNOSIS — O99019 Anemia complicating pregnancy, unspecified trimester: Secondary | ICD-10-CM

## 2013-10-20 DIAGNOSIS — Z1389 Encounter for screening for other disorder: Secondary | ICD-10-CM

## 2013-10-20 DIAGNOSIS — Z3482 Encounter for supervision of other normal pregnancy, second trimester: Secondary | ICD-10-CM

## 2013-10-20 DIAGNOSIS — O30009 Twin pregnancy, unspecified number of placenta and unspecified number of amniotic sacs, unspecified trimester: Secondary | ICD-10-CM

## 2013-10-20 DIAGNOSIS — Z331 Pregnant state, incidental: Secondary | ICD-10-CM

## 2013-10-20 DIAGNOSIS — O98519 Other viral diseases complicating pregnancy, unspecified trimester: Secondary | ICD-10-CM

## 2013-10-20 DIAGNOSIS — O99320 Drug use complicating pregnancy, unspecified trimester: Secondary | ICD-10-CM

## 2013-10-20 DIAGNOSIS — F1920 Other psychoactive substance dependence, uncomplicated: Secondary | ICD-10-CM

## 2013-10-20 LAB — POCT URINALYSIS DIPSTICK
Blood, UA: NEGATIVE
Glucose, UA: NEGATIVE
Ketones, UA: NEGATIVE
Nitrite, UA: NEGATIVE
Protein, UA: NEGATIVE

## 2013-10-20 MED ORDER — ACYCLOVIR 400 MG PO TABS: 400.0000 mg | ORAL_TABLET | Freq: Three times a day (TID) | ORAL | Status: DC

## 2013-10-20 NOTE — Progress Notes (Signed)
Begin NST twice weekly on Thursday and sonograms for growth and fluid etc q3weeks BP weight and urine results all reviewed and noted. Patient reports good fetal movement, denies any bleeding and no rupture of membranes symptoms or regular contractions. Patient is without complaints. All questions were answered.

## 2013-10-23 ENCOUNTER — Ambulatory Visit (INDEPENDENT_AMBULATORY_CARE_PROVIDER_SITE_OTHER): Payer: Medicaid Other | Admitting: Advanced Practice Midwife

## 2013-10-23 ENCOUNTER — Encounter: Payer: Self-pay | Admitting: Advanced Practice Midwife

## 2013-10-23 VITALS — BP 102/80 | Wt 244.0 lb

## 2013-10-23 DIAGNOSIS — Z331 Pregnant state, incidental: Secondary | ICD-10-CM

## 2013-10-23 DIAGNOSIS — R768 Other specified abnormal immunological findings in serum: Secondary | ICD-10-CM

## 2013-10-23 DIAGNOSIS — F1920 Other psychoactive substance dependence, uncomplicated: Secondary | ICD-10-CM

## 2013-10-23 DIAGNOSIS — O99019 Anemia complicating pregnancy, unspecified trimester: Secondary | ICD-10-CM

## 2013-10-23 DIAGNOSIS — O30009 Twin pregnancy, unspecified number of placenta and unspecified number of amniotic sacs, unspecified trimester: Secondary | ICD-10-CM

## 2013-10-23 DIAGNOSIS — O0990 Supervision of high risk pregnancy, unspecified, unspecified trimester: Secondary | ICD-10-CM

## 2013-10-23 DIAGNOSIS — O98519 Other viral diseases complicating pregnancy, unspecified trimester: Secondary | ICD-10-CM

## 2013-10-23 DIAGNOSIS — Z1389 Encounter for screening for other disorder: Secondary | ICD-10-CM

## 2013-10-23 DIAGNOSIS — O99320 Drug use complicating pregnancy, unspecified trimester: Secondary | ICD-10-CM

## 2013-10-23 LAB — POCT URINALYSIS DIPSTICK
Glucose, UA: NEGATIVE
Ketones, UA: NEGATIVE
NITRITE UA: NEGATIVE
Protein, UA: NEGATIVE

## 2013-10-23 NOTE — Progress Notes (Signed)
NST reactive X 2.  Normal pregnancy complaints.  F/U Monday for NST and Thursday for growth u/s.

## 2013-10-27 ENCOUNTER — Encounter: Payer: Self-pay | Admitting: Women's Health

## 2013-10-27 ENCOUNTER — Ambulatory Visit (INDEPENDENT_AMBULATORY_CARE_PROVIDER_SITE_OTHER): Payer: Medicaid Other | Admitting: Women's Health

## 2013-10-27 VITALS — BP 116/80 | Wt 246.0 lb

## 2013-10-27 DIAGNOSIS — F1290 Cannabis use, unspecified, uncomplicated: Secondary | ICD-10-CM

## 2013-10-27 DIAGNOSIS — O98519 Other viral diseases complicating pregnancy, unspecified trimester: Secondary | ICD-10-CM

## 2013-10-27 DIAGNOSIS — O0990 Supervision of high risk pregnancy, unspecified, unspecified trimester: Secondary | ICD-10-CM

## 2013-10-27 DIAGNOSIS — R768 Other specified abnormal immunological findings in serum: Secondary | ICD-10-CM

## 2013-10-27 DIAGNOSIS — Z331 Pregnant state, incidental: Secondary | ICD-10-CM

## 2013-10-27 DIAGNOSIS — O99320 Drug use complicating pregnancy, unspecified trimester: Secondary | ICD-10-CM

## 2013-10-27 DIAGNOSIS — O99019 Anemia complicating pregnancy, unspecified trimester: Secondary | ICD-10-CM

## 2013-10-27 DIAGNOSIS — F1920 Other psychoactive substance dependence, uncomplicated: Secondary | ICD-10-CM

## 2013-10-27 DIAGNOSIS — Z1389 Encounter for screening for other disorder: Secondary | ICD-10-CM

## 2013-10-27 DIAGNOSIS — R82998 Other abnormal findings in urine: Secondary | ICD-10-CM

## 2013-10-27 DIAGNOSIS — O30009 Twin pregnancy, unspecified number of placenta and unspecified number of amniotic sacs, unspecified trimester: Secondary | ICD-10-CM

## 2013-10-27 LAB — POCT URINALYSIS DIPSTICK
Blood, UA: NEGATIVE
GLUCOSE UA: NEGATIVE
Ketones, UA: NEGATIVE
Nitrite, UA: NEGATIVE

## 2013-10-27 NOTE — Patient Instructions (Signed)
Call the office or go to [facility] if:  You begin to have strong, frequent contractions  Your water breaks.  Sometimes it is a big gush of fluid, sometimes it is just a trickle that keeps getting your panties wet or running down your legs  You have vaginal bleeding.  It is normal to have a small amount of spotting if your cervix was checked.   You don't feel your baby moving like normal.  If you don't, get you something to eat and drink and lay down and focus on feeling your baby move.  You should feel at least 10 movements in 2 hours.  If you don't, you should call the office or go to [facility].   

## 2013-10-27 NOTE — Progress Notes (Signed)
Reports good fm x2. Denies uc's, lof, vb, urinary urgency, hesitancy, or dysuria.  Some urinary frequency, leuks and tr protein- will send urine cx.  NST reactive x 2.  Reviewed ptl s/s, fkc.  All questions answered. F/U in 3d for NST, growth u/s next Thurs.

## 2013-10-30 ENCOUNTER — Other Ambulatory Visit: Payer: Medicaid Other

## 2013-10-30 ENCOUNTER — Encounter: Payer: Self-pay | Admitting: Women's Health

## 2013-10-30 ENCOUNTER — Ambulatory Visit (INDEPENDENT_AMBULATORY_CARE_PROVIDER_SITE_OTHER): Payer: Medicaid Other | Admitting: Obstetrics & Gynecology

## 2013-10-30 VITALS — BP 120/80 | Wt 247.0 lb

## 2013-10-30 DIAGNOSIS — Z1389 Encounter for screening for other disorder: Secondary | ICD-10-CM

## 2013-10-30 DIAGNOSIS — O30009 Twin pregnancy, unspecified number of placenta and unspecified number of amniotic sacs, unspecified trimester: Secondary | ICD-10-CM

## 2013-10-30 DIAGNOSIS — O99320 Drug use complicating pregnancy, unspecified trimester: Secondary | ICD-10-CM

## 2013-10-30 DIAGNOSIS — Z331 Pregnant state, incidental: Secondary | ICD-10-CM

## 2013-10-30 DIAGNOSIS — F1920 Other psychoactive substance dependence, uncomplicated: Secondary | ICD-10-CM

## 2013-10-30 DIAGNOSIS — O98519 Other viral diseases complicating pregnancy, unspecified trimester: Secondary | ICD-10-CM

## 2013-10-30 DIAGNOSIS — O99019 Anemia complicating pregnancy, unspecified trimester: Secondary | ICD-10-CM

## 2013-10-30 LAB — POCT URINALYSIS DIPSTICK
Blood, UA: NEGATIVE
GLUCOSE UA: NEGATIVE
Ketones, UA: NEGATIVE
NITRITE UA: NEGATIVE

## 2013-10-30 LAB — DRUG SCREEN, URINE, NO CONFIRMATION
Amphetamine Screen, Ur: NEGATIVE
Barbiturate Quant, Ur: NEGATIVE
Benzodiazepines.: NEGATIVE
CREATININE, U: 150.6 mg/dL
Cocaine Metabolites: NEGATIVE
Marijuana Metabolite: POSITIVE — AB
Methadone: NEGATIVE
Opiate Screen, Urine: NEGATIVE
PHENCYCLIDINE (PCP): NEGATIVE
Propoxyphene: NEGATIVE

## 2013-10-30 NOTE — Progress Notes (Signed)
Reactive NST x 2 BP weight and urine results all reviewed and noted. Patient reports good fetal movement, denies any bleeding and no rupture of membranes symptoms or regular contractions. Patient is without complaints. All questions were answered. Sonogram next Thursday for growth

## 2013-11-03 ENCOUNTER — Ambulatory Visit (INDEPENDENT_AMBULATORY_CARE_PROVIDER_SITE_OTHER): Payer: Medicaid Other | Admitting: Obstetrics & Gynecology

## 2013-11-03 ENCOUNTER — Encounter: Payer: Self-pay | Admitting: Obstetrics & Gynecology

## 2013-11-03 ENCOUNTER — Other Ambulatory Visit: Payer: Self-pay | Admitting: Obstetrics & Gynecology

## 2013-11-03 VITALS — BP 130/80 | Wt 249.0 lb

## 2013-11-03 DIAGNOSIS — F1920 Other psychoactive substance dependence, uncomplicated: Secondary | ICD-10-CM

## 2013-11-03 DIAGNOSIS — O30009 Twin pregnancy, unspecified number of placenta and unspecified number of amniotic sacs, unspecified trimester: Secondary | ICD-10-CM

## 2013-11-03 DIAGNOSIS — O98519 Other viral diseases complicating pregnancy, unspecified trimester: Secondary | ICD-10-CM

## 2013-11-03 DIAGNOSIS — O99320 Drug use complicating pregnancy, unspecified trimester: Secondary | ICD-10-CM

## 2013-11-03 DIAGNOSIS — Z331 Pregnant state, incidental: Secondary | ICD-10-CM

## 2013-11-03 DIAGNOSIS — Z1389 Encounter for screening for other disorder: Secondary | ICD-10-CM

## 2013-11-03 DIAGNOSIS — O99019 Anemia complicating pregnancy, unspecified trimester: Secondary | ICD-10-CM

## 2013-11-03 LAB — POCT URINALYSIS DIPSTICK
Blood, UA: NEGATIVE
Glucose, UA: NEGATIVE
Ketones, UA: NEGATIVE
NITRITE UA: NEGATIVE

## 2013-11-03 NOTE — Progress Notes (Signed)
Reactive NST x 2 BP weight and urine results all reviewed and noted. Patient reports good fetal movement, denies any bleeding and no rupture of membranes symptoms or regular contractions. Patient is without complaints. All questions were answered. Sonogram thursday

## 2013-11-06 ENCOUNTER — Ambulatory Visit (INDEPENDENT_AMBULATORY_CARE_PROVIDER_SITE_OTHER): Payer: Medicaid Other | Admitting: Obstetrics and Gynecology

## 2013-11-06 ENCOUNTER — Encounter: Payer: Self-pay | Admitting: Obstetrics and Gynecology

## 2013-11-06 ENCOUNTER — Ambulatory Visit (INDEPENDENT_AMBULATORY_CARE_PROVIDER_SITE_OTHER): Payer: Medicaid Other

## 2013-11-06 VITALS — BP 112/78 | Wt 259.8 lb

## 2013-11-06 DIAGNOSIS — O30009 Twin pregnancy, unspecified number of placenta and unspecified number of amniotic sacs, unspecified trimester: Secondary | ICD-10-CM

## 2013-11-06 DIAGNOSIS — O99019 Anemia complicating pregnancy, unspecified trimester: Secondary | ICD-10-CM

## 2013-11-06 DIAGNOSIS — F1920 Other psychoactive substance dependence, uncomplicated: Secondary | ICD-10-CM

## 2013-11-06 DIAGNOSIS — O99320 Drug use complicating pregnancy, unspecified trimester: Secondary | ICD-10-CM

## 2013-11-06 DIAGNOSIS — O0990 Supervision of high risk pregnancy, unspecified, unspecified trimester: Secondary | ICD-10-CM

## 2013-11-06 DIAGNOSIS — O98519 Other viral diseases complicating pregnancy, unspecified trimester: Secondary | ICD-10-CM

## 2013-11-06 DIAGNOSIS — Z1389 Encounter for screening for other disorder: Secondary | ICD-10-CM

## 2013-11-06 DIAGNOSIS — Z331 Pregnant state, incidental: Secondary | ICD-10-CM

## 2013-11-06 LAB — POCT URINALYSIS DIPSTICK
Glucose, UA: NEGATIVE
KETONES UA: NEGATIVE
Nitrite, UA: NEGATIVE
PROTEIN UA: NEGATIVE
RBC UA: NEGATIVE

## 2013-11-06 LAB — OB RESULTS CONSOLE GC/CHLAMYDIA
Chlamydia: NEGATIVE
Gonorrhea: NEGATIVE

## 2013-11-06 NOTE — Progress Notes (Signed)
U/S(36+3wks)- Twin DI-DI membrane noted, posterior/fundal Gr3 placenta, EFW Discordance 3% Baby A(maternal Lt side)-vtx active fetus, BPP 8/8, fluid wnl single deepest pocket-4.1cm, FHR-145 bpm, EFW 6 lb 6 oz (47th%tile), female fetus Baby B(maternal Rt side)-vtx active fetus, BPP 8/8, fluid wnl, single deepest pocket-3.5cm, FHR-138 bpm, EFW 6 lb 3 oz (39th%tile), female fetus

## 2013-11-06 NOTE — Progress Notes (Signed)
U/s review EFW 39% and 47%iles,The current discrepancy in fetal weight between the twins is 3% which is not clinically relevant, vtx/vtx presentation

## 2013-11-06 NOTE — Progress Notes (Signed)
2460w3d with twins. Babies are both > 6 lbs each and head first per u/s today. No pt concerns  Cervix is soft, closed, long, -3  NST mon/ BPP Thursdays    IOL at 38 wk planned (11/17/13)

## 2013-11-06 NOTE — Patient Instructions (Signed)
Doing great.

## 2013-11-07 LAB — GC/CHLAMYDIA PROBE AMP
CT Probe RNA: NEGATIVE
GC PROBE AMP APTIMA: NEGATIVE

## 2013-11-08 LAB — STREP B DNA PROBE: GBSP: NEGATIVE

## 2013-11-10 ENCOUNTER — Encounter: Payer: Self-pay | Admitting: Women's Health

## 2013-11-10 ENCOUNTER — Ambulatory Visit (INDEPENDENT_AMBULATORY_CARE_PROVIDER_SITE_OTHER): Payer: Medicaid Other | Admitting: Women's Health

## 2013-11-10 ENCOUNTER — Telehealth (HOSPITAL_COMMUNITY): Payer: Self-pay | Admitting: *Deleted

## 2013-11-10 ENCOUNTER — Other Ambulatory Visit: Payer: Medicaid Other | Admitting: Obstetrics and Gynecology

## 2013-11-10 ENCOUNTER — Encounter: Payer: Self-pay | Admitting: Obstetrics and Gynecology

## 2013-11-10 VITALS — BP 110/84 | Wt 255.0 lb

## 2013-11-10 DIAGNOSIS — Z331 Pregnant state, incidental: Secondary | ICD-10-CM

## 2013-11-10 DIAGNOSIS — O99320 Drug use complicating pregnancy, unspecified trimester: Secondary | ICD-10-CM

## 2013-11-10 DIAGNOSIS — O98519 Other viral diseases complicating pregnancy, unspecified trimester: Secondary | ICD-10-CM

## 2013-11-10 DIAGNOSIS — Z1389 Encounter for screening for other disorder: Secondary | ICD-10-CM

## 2013-11-10 DIAGNOSIS — O0990 Supervision of high risk pregnancy, unspecified, unspecified trimester: Secondary | ICD-10-CM

## 2013-11-10 DIAGNOSIS — O99019 Anemia complicating pregnancy, unspecified trimester: Secondary | ICD-10-CM

## 2013-11-10 DIAGNOSIS — F1920 Other psychoactive substance dependence, uncomplicated: Secondary | ICD-10-CM

## 2013-11-10 DIAGNOSIS — O30009 Twin pregnancy, unspecified number of placenta and unspecified number of amniotic sacs, unspecified trimester: Secondary | ICD-10-CM

## 2013-11-10 LAB — POCT URINALYSIS DIPSTICK
Blood, UA: NEGATIVE
GLUCOSE UA: NEGATIVE
KETONES UA: NEGATIVE
Nitrite, UA: NEGATIVE
Protein, UA: NEGATIVE

## 2013-11-10 NOTE — Telephone Encounter (Signed)
Preadmission screen  

## 2013-11-10 NOTE — Progress Notes (Signed)
Reports good fm x 2. Denies regular/painful uc's, lof, vb, uti s/s.  Some irregular BH. NST reactive x 2, mild uc's q 5mins- not perceived by pt. IOL scheduled for 2/9 @ 1930. Both babies vtx on 1/29 u/s.  Reviewed labor s/s, fkc.  All questions answered. F/U in 3d for bpp u/s and visit.

## 2013-11-10 NOTE — Patient Instructions (Signed)
Your induction is scheduled for 2/9 @ 7:30pm. Go to [facility] hospital, Maternity Admissions Unit (Emergency) entrance and let them know you are there to be induced. They will send someone from Labor & Delivery to come get you.   Braxton Hicks Contractions Pregnancy is commonly associated with contractions of the uterus throughout the pregnancy. Towards the end of pregnancy (32 to 34 weeks), these contractions [facility] Willa Rough) can develop more often and may become more forceful. This is not true labor because these contractions do not result in opening (dilatation) and thinning of the cervix. They are sometimes difficult to tell apart from true labor because these contractions can be forceful and people have different pain tolerances. You should not feel embarrassed if you go to the hospital with false labor. Sometimes, the only way to tell if you are in true labor is for your caregiver to follow the changes in the cervix. How to tell the difference between true and false labor:  False labor.  The contractions of false labor are usually shorter, irregular and not as hard as those of true labor.  They are often felt in the front of the lower abdomen and in the groin.  They may leave with walking around or changing positions while lying down.  They get weaker and are shorter lasting as time goes on.  These contractions are usually irregular.  They do not usually become progressively stronger, regular and closer together as with true labor.  True labor.  Contractions in true labor last 30 to 70 seconds, become very regular, usually become more intense, and increase in frequency.  They do not go away with walking.  The discomfort is usually felt in the top of the uterus and spreads to the lower abdomen and low back.  True labor can be determined by your caregiver with an exam. This will show that the cervix is dilating and getting thinner. If there are no prenatal problems or other health  problems associated with the pregnancy, it is completely safe to be sent home with false labor and await the onset of true labor. HOME CARE INSTRUCTIONS   Keep up with your usual exercises and instructions.  Take medications as directed.  Keep your regular prenatal appointment.  Eat and drink lightly if you think you are going into labor.  If BH contractions are making you uncomfortable:  Change your activity position from lying down or resting to walking/walking to resting.  Sit and rest in a tub of warm water.  Drink 2 to 3 glasses of water. Dehydration may cause B-H contractions.  Do slow and deep breathing several times an hour. SEEK IMMEDIATE MEDICAL CARE IF:   Your contractions continue to become stronger, more regular, and closer together.  You have a gushing, burst or leaking of fluid from the vagina.  An oral temperature above 102 F (38.9 C) develops.  You have passage of blood-tinged mucus.  You develop vaginal bleeding.  You develop continuous belly (abdominal) pain.  You have low back pain that you never had before.  You feel the baby's head pushing down causing pelvic pressure.  The baby is not moving as much as it used to. Document Released: 09/25/2005 Document Revised: 12/18/2011 Document Reviewed: 07/07/2013 [facility] Patient Information 2014 [place], [place].  Labor Induction  Labor induction is when steps are taken to cause a pregnant woman to begin the labor process. Most women go into labor on their own between 37 weeks and 42 weeks of the pregnancy. When  this does not happen or when there is a medical need, methods may be used to induce labor. Labor induction causes a pregnant woman's uterus to contract. It also causes the cervix to soften (ripen), open (dilate), and thin out (efface). Usually, labor is not induced before 39 weeks of the pregnancy unless there is a problem with the baby or mother.  Before inducing labor, your health care provider will  consider a number of factors, including the following:  The medical condition of you and the baby.   How many weeks along you are.   The status of the baby's lung maturity.   The condition of the cervix.   The position of the baby.  WHAT ARE THE REASONS FOR LABOR INDUCTION? Labor may be induced for the following reasons:  The health of the baby or mother is at risk.   The pregnancy is overdue by 1 week or more.   The water breaks but labor does not start on its own.   The mother has a health condition or serious illness, such as high blood pressure, infection, placental abruption, or diabetes.  The amniotic fluid amounts are low around the baby.   The baby is distressed.  Convenience or wanting the baby to be born on a certain date is not a reason for inducing labor. WHAT METHODS ARE USED FOR LABOR INDUCTION? Several methods of labor induction may be used, such as:   Prostaglandin medicine. This medicine causes the cervix to dilate and ripen. The medicine will also start contractions. It can be taken by mouth or by inserting a suppository into the vagina.   Inserting a thin tube (catheter) with a balloon on the end into the vagina to dilate the cervix. Once inserted, the balloon is expanded with water, which causes the cervix to open.   Stripping the membranes. Your health care provider separates amniotic sac tissue from the cervix, causing the cervix to be stretched and causing the release of a hormone called progesterone. This may cause the uterus to contract. It is often done during an office visit. You will be sent home to wait for the contractions to begin. You will then come in for an induction.   Breaking the water. Your health care provider makes a hole in the amniotic sac using a small instrument. Once the amniotic sac breaks, contractions should begin. This may still take hours to see an effect.   Medicine to trigger or strengthen contractions. This  medicine is given through an IV access tube inserted into a vein in your arm.  All of the methods of induction, besides stripping the membranes, will be done in the hospital. Induction is done in the hospital so that you and the baby can be carefully monitored.  HOW LONG DOES IT TAKE FOR LABOR TO BE INDUCED? Some inductions can take up to 2 3 days. Depending on the cervix, it usually takes less time. It takes longer when you are induced early in the pregnancy or if this is your first pregnancy. If a mother is still pregnant and the induction has been going on for 2 3 days, either the mother will be sent home or a cesarean delivery will be needed. WHAT ARE THE RISKS ASSOCIATED WITH LABOR INDUCTION? Some of the risks of induction include:   Changes in fetal heart rate, such as too high, too low, or erratic.   Fetal distress.   Chance of infection for the mother and baby.   Increased chance of  having a cesarean delivery.   Breaking off (abruption) of the placenta from the uterus (rare).   Uterine rupture (very rare).  When induction is needed for medical reasons, the benefits of induction may outweigh the risks. WHAT ARE SOME REASONS FOR NOT INDUCING LABOR? Labor induction should not be done if:   It is shown that your baby does not tolerate labor.   You have had previous surgeries on your uterus, such as a myomectomy or the removal of fibroids.   Your placenta lies very low in the uterus and blocks the opening of the cervix (placenta previa).   Your baby is not in a head-down position.   The umbilical cord drops down into the birth canal in front of the baby. This could cut off the baby's blood and oxygen supply.   You have had a previous cesarean delivery.   There are unusual circumstances, such as the baby being extremely premature.  Document Released: 02/14/2007 Document Revised: 05/28/2013 Document Reviewed: 04/24/2013 [facility] Patient Information 2014 [place]

## 2013-11-13 ENCOUNTER — Ambulatory Visit (INDEPENDENT_AMBULATORY_CARE_PROVIDER_SITE_OTHER): Payer: Medicaid Other | Admitting: Advanced Practice Midwife

## 2013-11-13 ENCOUNTER — Ambulatory Visit (INDEPENDENT_AMBULATORY_CARE_PROVIDER_SITE_OTHER): Payer: Medicaid Other

## 2013-11-13 ENCOUNTER — Encounter: Payer: Self-pay | Admitting: Advanced Practice Midwife

## 2013-11-13 VITALS — BP 130/90 | Wt 254.4 lb

## 2013-11-13 DIAGNOSIS — O0990 Supervision of high risk pregnancy, unspecified, unspecified trimester: Secondary | ICD-10-CM

## 2013-11-13 DIAGNOSIS — O30009 Twin pregnancy, unspecified number of placenta and unspecified number of amniotic sacs, unspecified trimester: Secondary | ICD-10-CM

## 2013-11-13 DIAGNOSIS — O99320 Drug use complicating pregnancy, unspecified trimester: Secondary | ICD-10-CM

## 2013-11-13 DIAGNOSIS — Z331 Pregnant state, incidental: Secondary | ICD-10-CM

## 2013-11-13 DIAGNOSIS — F1920 Other psychoactive substance dependence, uncomplicated: Secondary | ICD-10-CM

## 2013-11-13 DIAGNOSIS — O99019 Anemia complicating pregnancy, unspecified trimester: Secondary | ICD-10-CM

## 2013-11-13 DIAGNOSIS — Z1389 Encounter for screening for other disorder: Secondary | ICD-10-CM

## 2013-11-13 DIAGNOSIS — O98519 Other viral diseases complicating pregnancy, unspecified trimester: Secondary | ICD-10-CM

## 2013-11-13 LAB — POCT URINALYSIS DIPSTICK
Blood, UA: NEGATIVE
GLUCOSE UA: NEGATIVE
LEUKOCYTES UA: NEGATIVE
NITRITE UA: NEGATIVE
Protein, UA: NEGATIVE

## 2013-11-13 NOTE — Progress Notes (Signed)
U/S(37+3wks)- twin IUP, membrane + noted, Baby A-Maternal Lt side, Baby B-Maternal Lt side, Gr 3 placenta Baby A- vtx active fetus, BPP 8/8, fluid wnl single deepest pocket=2.85cm, FHR- 135 bpm, female fetus Baby b- vtx active fetus, BPP 8/8, fluid wnl single deepest pocket=5.5cm, FHR-131 bpm, female fetus

## 2013-11-17 ENCOUNTER — Encounter (HOSPITAL_COMMUNITY): Payer: Self-pay

## 2013-11-17 ENCOUNTER — Inpatient Hospital Stay (HOSPITAL_COMMUNITY)
Admission: RE | Admit: 2013-11-17 | Discharge: 2013-11-20 | DRG: 774 | Disposition: A | Discharge status: Home / Self Care | Payer: Medicaid Other | Source: Ambulatory Visit | Attending: Obstetrics and Gynecology | Admitting: Obstetrics and Gynecology

## 2013-11-17 VITALS — BP 115/79 | HR 81 | Temp 97.8°F | Resp 18 | Ht 70.0 in | Wt 254.0 lb

## 2013-11-17 DIAGNOSIS — O98519 Other viral diseases complicating pregnancy, unspecified trimester: Secondary | ICD-10-CM | POA: Diagnosis present

## 2013-11-17 DIAGNOSIS — Z87891 Personal history of nicotine dependence: Secondary | ICD-10-CM

## 2013-11-17 DIAGNOSIS — O30009 Twin pregnancy, unspecified number of placenta and unspecified number of amniotic sacs, unspecified trimester: Secondary | ICD-10-CM

## 2013-11-17 DIAGNOSIS — F1210 Cannabis abuse, uncomplicated: Secondary | ICD-10-CM | POA: Diagnosis present

## 2013-11-17 DIAGNOSIS — O0990 Supervision of high risk pregnancy, unspecified, unspecified trimester: Secondary | ICD-10-CM

## 2013-11-17 DIAGNOSIS — O99344 Other mental disorders complicating childbirth: Secondary | ICD-10-CM

## 2013-11-17 DIAGNOSIS — A6000 Herpesviral infection of urogenital system, unspecified: Secondary | ICD-10-CM | POA: Diagnosis present

## 2013-11-17 DIAGNOSIS — R768 Other specified abnormal immunological findings in serum: Secondary | ICD-10-CM

## 2013-11-17 DIAGNOSIS — O30049 Twin pregnancy, dichorionic/diamniotic, unspecified trimester: Secondary | ICD-10-CM

## 2013-11-17 LAB — CBC
HCT: 31.6 % — ABNORMAL LOW (ref 36.0–46.0)
Hemoglobin: 10.8 g/dL — ABNORMAL LOW (ref 12.0–15.0)
MCH: 28.4 pg (ref 26.0–34.0)
MCHC: 34.2 g/dL (ref 30.0–36.0)
MCV: 83.2 fL (ref 78.0–100.0)
PLATELETS: 258 10*3/uL (ref 150–400)
RBC: 3.8 MIL/uL — ABNORMAL LOW (ref 3.87–5.11)
RDW: 14 % (ref 11.5–15.5)
WBC: 9.4 10*3/uL (ref 4.0–10.5)

## 2013-11-17 MED ORDER — OXYTOCIN BOLUS FROM INFUSION: 500.0000 mL | INTRAVENOUS | Status: DC

## 2013-11-17 MED ORDER — LACTATED RINGERS IV SOLN: 500.0000 mL | INTRAVENOUS | Status: DC | PRN

## 2013-11-17 MED ORDER — LACTATED RINGERS IV SOLN
INTRAVENOUS | Status: DC
  Administered 2013-11-18 (×3): via INTRAVENOUS

## 2013-11-17 MED ORDER — ACYCLOVIR 400 MG PO TABS
400.0000 mg | ORAL_TABLET | Freq: Three times a day (TID) | ORAL | Status: DC
  Administered 2013-11-17 – 2013-11-20 (×8): 400 mg via ORAL
  Filled 2013-11-17 (×12): qty 1

## 2013-11-17 MED ORDER — OXYCODONE-ACETAMINOPHEN 5-325 MG PO TABS: 1.0000 | ORAL_TABLET | ORAL | Status: DC | PRN

## 2013-11-17 MED ORDER — LIDOCAINE HCL (PF) 1 % IJ SOLN
30.0000 mL | INTRAMUSCULAR | Status: DC | PRN
  Filled 2013-11-17: qty 30

## 2013-11-17 MED ORDER — CITRIC ACID-SODIUM CITRATE 334-500 MG/5ML PO SOLN: 30.0000 mL | ORAL | Status: DC | PRN

## 2013-11-17 MED ORDER — OXYTOCIN 40 UNITS IN LACTATED RINGERS INFUSION - SIMPLE MED
62.5000 mL/h | INTRAVENOUS | Status: DC
  Administered 2013-11-19: 62.5 mL/h via INTRAVENOUS
  Filled 2013-11-17: qty 1000

## 2013-11-17 MED ORDER — FLEET ENEMA 7-19 GM/118ML RE ENEM: 1.0000 | ENEMA | RECTAL | Status: DC | PRN

## 2013-11-17 MED ORDER — ACETAMINOPHEN 325 MG PO TABS: 650.0000 mg | ORAL_TABLET | ORAL | Status: DC | PRN

## 2013-11-17 MED ORDER — ONDANSETRON HCL 4 MG/2ML IJ SOLN: 4.0000 mg | Freq: Four times a day (QID) | INTRAMUSCULAR | Status: DC | PRN

## 2013-11-17 MED ORDER — IBUPROFEN 600 MG PO TABS: 600.0000 mg | ORAL_TABLET | Freq: Four times a day (QID) | ORAL | Status: DC | PRN

## 2013-11-17 MED ORDER — ZOLPIDEM TARTRATE 5 MG PO TABS
5.0000 mg | ORAL_TABLET | Freq: Every evening | ORAL | Status: DC | PRN
  Administered 2013-11-17: 5 mg via ORAL
  Filled 2013-11-17: qty 1

## 2013-11-17 NOTE — H&P (Signed)
Cindy Sosa is a 23 y.o. G2P1001 presenting for IOl at 38 weeks due to twin gestation. Cindy Sosa is doing well, is not feeling contractions or pain. Denies VB, LOF, recent illness. Reports good FM.  This pregnancy has been high risk due to twin gestation. Patient is currently taking acyclovir for seropositive HSV-2.  Otherwise, no other complications this pregnancy.  Prior pregnancy was complicated by young age and infant with diaphragmatic hernia.  Meds: acyclovir, PNV Alls: none . Maternal Medical History:  Reason for admission: Nausea.    OB History   Grav Para Term Preterm Abortions TAB SAB Ect Mult Living   2 1 1       1      Past Medical History  Diagnosis Date  . Medical history non-contributory    Past Surgical History  Procedure Laterality Date  . No past surgeries     Family History: family history includes Hypertension in her paternal aunt and paternal grandmother. Social History:  reports that she has quit smoking. Her smoking use included Cigarettes. She smoked 0.25 packs per day. She has never used smokeless tobacco. She reports that she uses illicit drugs (Marijuana). She reports that she does not drink alcohol.   Prenatal Transfer Tool  Maternal Diabetes: No Genetic Screening: Normal Maternal Ultrasounds/Referrals: Normal Fetal Ultrasounds or other Referrals:  None Maternal Substance Abuse:  Yes:  Type: Marijuana Significant Maternal Medications:  None Significant Maternal Lab Results:  None Other Comments:  Twin gestation  Review of Systems  Constitutional: Negative for fever.  HENT: Negative for congestion and sore throat.   Eyes: Negative for blurred vision and double vision.  Respiratory: Negative for cough and shortness of breath.   Cardiovascular: Negative for chest pain.  Gastrointestinal: Negative for nausea, vomiting, abdominal pain and diarrhea.  Genitourinary: Negative for dysuria.  Musculoskeletal: Negative for joint pain and myalgias.   Neurological: Negative for dizziness, tingling and headaches.      Blood pressure 135/94, pulse 117, temperature 98.1 F (36.7 C), temperature source Oral, resp. rate 20, height 5\' 10"  (1.778 m), weight 115.214 kg (254 lb), last menstrual period 02/18/2013. Exam Physical Exam  Constitutional: She is oriented to person, place, and time. She appears well-developed and well-nourished.  HENT:  Head: Atraumatic.  Cardiovascular: Normal rate, regular rhythm, normal heart sounds and intact distal pulses.   Respiratory: Effort normal and breath sounds normal. No respiratory distress.  GI: Soft. Bowel sounds are normal.  Appropriately gravid uterus. Nontender  Genitourinary: Vagina normal and uterus normal.  + bloody show w/ cervical check.   Musculoskeletal: She exhibits no edema and no tenderness.  Neurological: She is alert and oriented to person, place, and time.  No focal deficits  Skin: Skin is warm and dry.  Psychiatric: She has a normal mood and affect. Her behavior is normal. Thought content normal.    Dilation: 1.5 Effacement (%): Thick Cervical Position: Posterior Station: -3 Presentation: Vertex Exam by:: B.Cagna, RN   Dr. Ike Benedom at bedside confirmed vertex presentation of twin A & B  FWB: Cat 1 for A and B.  Baseline: 130, 140.  Mod variabiility x2.  Accels present x2.  Decels absentx2.  UC's irregular q 2-675min  Prenatal labs: ABO, Rh: O/POS/-- (07/08 1155) Antibody: NEG (12/01 1035) Rubella: 3.41 (07/08 1155) RPR: NON REAC (12/01 1035)  HBsAg: NEGATIVE (07/08 1155)  HIV: NON REACTIVE (12/01 1035)  GBS: NEGATIVE (01/29 1115)   Assessment/Plan:  # Di-Di Twin Female Gestation at 38 wks, High risk  pregnancy - Admit to labor and delivery for IOL - Desires Epidural  - BP wnl prenatal - 2hr GTT wnl - GBS neg - EFW 47th and 39th %tile on 1/29 - Foley bulb placed  # THC+ UDS prior - UDS today - c/s SW ppd  # Seropositive HSV-2 - continue Acyclovir - no active  outbreak currently  # PPD concerns - bottle - nexplanon/ desires BTL - circ outpatient   Cindy Sosa 11/17/2013, 9:16 PM  Cindy Sosa is a 23 y.o. G2P1001 at [redacted]w[redacted]d with twin pregnancy induction of labor #labor: Will start induction with FB, then progress to pitocin. Consider AROM when engaged. #Pain: Desires IV pain meds #FWB: Cat I #ID:  GBS Neg #MOF: Breast/bottle #MOC: Nexplanon/BTL #Circ:  Desires as outpatient   I spoke with and examined patient and agree with resident's note and plan of care.  Tawana Scale, MD OB Fellow 11/18/2013 1:15 AM

## 2013-11-18 ENCOUNTER — Encounter (HOSPITAL_COMMUNITY): Payer: Medicaid Other | Admitting: Anesthesiology

## 2013-11-18 ENCOUNTER — Encounter (HOSPITAL_COMMUNITY): Payer: Self-pay

## 2013-11-18 ENCOUNTER — Inpatient Hospital Stay (HOSPITAL_COMMUNITY): Payer: Medicaid Other | Admitting: Anesthesiology

## 2013-11-18 LAB — CBC
HCT: 30.5 % — ABNORMAL LOW (ref 36.0–46.0)
Hemoglobin: 10.3 g/dL — ABNORMAL LOW (ref 12.0–15.0)
MCH: 28.3 pg (ref 26.0–34.0)
MCHC: 33.8 g/dL (ref 30.0–36.0)
MCV: 83.8 fL (ref 78.0–100.0)
PLATELETS: 226 10*3/uL (ref 150–400)
RBC: 3.64 MIL/uL — AB (ref 3.87–5.11)
RDW: 14.1 % (ref 11.5–15.5)
WBC: 10.4 10*3/uL (ref 4.0–10.5)

## 2013-11-18 LAB — ABO/RH: ABO/RH(D): O POS

## 2013-11-18 LAB — RPR: RPR Ser Ql: NONREACTIVE

## 2013-11-18 LAB — RAPID URINE DRUG SCREEN, HOSP PERFORMED
AMPHETAMINES: NOT DETECTED
BARBITURATES: NOT DETECTED
Benzodiazepines: NOT DETECTED
COCAINE: NOT DETECTED
Opiates: NOT DETECTED
TETRAHYDROCANNABINOL: POSITIVE — AB

## 2013-11-18 LAB — TYPE AND SCREEN
ABO/RH(D): O POS
Antibody Screen: NEGATIVE

## 2013-11-18 MED ORDER — TERBUTALINE SULFATE 1 MG/ML IJ SOLN: 0.2500 mg | Freq: Once | INTRAMUSCULAR | Status: AC | PRN

## 2013-11-18 MED ORDER — EPHEDRINE 5 MG/ML INJ
10.0000 mg | INTRAVENOUS | Status: DC | PRN
  Filled 2013-11-18: qty 4
  Filled 2013-11-18: qty 2

## 2013-11-18 MED ORDER — EPHEDRINE 5 MG/ML INJ
10.0000 mg | INTRAVENOUS | Status: DC | PRN
  Filled 2013-11-18: qty 2

## 2013-11-18 MED ORDER — LACTATED RINGERS IV SOLN
500.0000 mL | Freq: Once | INTRAVENOUS | Status: AC
  Administered 2013-11-18: 500 mL via INTRAVENOUS

## 2013-11-18 MED ORDER — FENTANYL CITRATE 0.05 MG/ML IJ SOLN
100.0000 ug | INTRAMUSCULAR | Status: DC | PRN
  Administered 2013-11-18 (×4): 100 ug via INTRAVENOUS
  Filled 2013-11-18 (×4): qty 2

## 2013-11-18 MED ORDER — FENTANYL 2.5 MCG/ML BUPIVACAINE 1/10 % EPIDURAL INFUSION (WH - ANES)
14.0000 mL/h | INTRAMUSCULAR | Status: DC | PRN
  Filled 2013-11-18: qty 125

## 2013-11-18 MED ORDER — DIPHENHYDRAMINE HCL 50 MG/ML IJ SOLN: 12.5000 mg | INTRAMUSCULAR | Status: DC | PRN

## 2013-11-18 MED ORDER — LIDOCAINE HCL (PF) 1 % IJ SOLN
INTRAMUSCULAR | Status: DC | PRN
  Administered 2013-11-18 (×2): 9 mL

## 2013-11-18 MED ORDER — FENTANYL 2.5 MCG/ML BUPIVACAINE 1/10 % EPIDURAL INFUSION (WH - ANES)
INTRAMUSCULAR | Status: DC | PRN
  Administered 2013-11-18: 14 mL/h via EPIDURAL

## 2013-11-18 MED ORDER — PHENYLEPHRINE 40 MCG/ML (10ML) SYRINGE FOR IV PUSH (FOR BLOOD PRESSURE SUPPORT)
80.0000 ug | PREFILLED_SYRINGE | INTRAVENOUS | Status: DC | PRN
  Filled 2013-11-18: qty 2

## 2013-11-18 MED ORDER — OXYTOCIN 40 UNITS IN LACTATED RINGERS INFUSION - SIMPLE MED
1.0000 m[IU]/min | INTRAVENOUS | Status: DC
  Administered 2013-11-18: 2 m[IU]/min via INTRAVENOUS
  Filled 2013-11-18: qty 1000

## 2013-11-18 MED ORDER — MISOPROSTOL 200 MCG PO TABS
ORAL_TABLET | ORAL | Status: AC
  Administered 2013-11-19: 800 ug
  Filled 2013-11-18: qty 4

## 2013-11-18 MED ORDER — PHENYLEPHRINE 40 MCG/ML (10ML) SYRINGE FOR IV PUSH (FOR BLOOD PRESSURE SUPPORT)
80.0000 ug | PREFILLED_SYRINGE | INTRAVENOUS | Status: DC | PRN
  Filled 2013-11-18: qty 10
  Filled 2013-11-18: qty 2

## 2013-11-18 NOTE — Progress Notes (Signed)
Cindy Sosa is a 23 y.o. G2P1001 at 1253w1d admitted for induction of labor due to twins.  Subjective: Feeling contractions, pain not severe   Objective: BP 119/63  Pulse 84  Temp(Src) 97.8 F (36.6 C) (Oral)  Resp 18  Ht 5\' 10"  (1.778 m)  Wt 115.214 kg (254 lb)  BMI 36.45 kg/m2  LMP 02/18/2013      FHTA:  FHR: 120 bpm, variability: moderate,  accelerations:  Present,  decelerations:  Absent FHTB:  FHR: 135 bpm, variability: moderate,  accelerations:  Present,  decelerations:  Present rare variable UC:   regular, every 2-3 minutes SVE:   Dilation: 5.5 Effacement (%): 60 Station: -1 Exam by:: W. Muhammuad  Labs: Lab Results  Component Value Date   WBC 9.4 11/17/2013   HGB 10.8* 11/17/2013   HCT 31.6* 11/17/2013   MCV 83.2 11/17/2013   PLT 258 11/17/2013    Assessment / Plan: Induction of labor due to multifetal gestation,  progressing well on pitocin  Labor: Progressing normally and on pitocin, AROM at 3:30pm Preeclampsia:  no signs or symptoms of toxicity Fetal Wellbeing:  Category I Pain Control:  Fentanyl I/D:  n/a Anticipated MOD:  NSVD  Cindy Sosa, Cindy Sosa 11/18/2013, 3:45 PM

## 2013-11-18 NOTE — Anesthesia Preprocedure Evaluation (Signed)
Anesthesia Evaluation  Patient identified by MRN, date of birth, ID band Patient awake    Reviewed: Allergy & Precautions, H&P , NPO status , Patient's Chart, lab work & pertinent test results  Airway Mallampati: II TM Distance: >3 FB Neck ROM: full    Dental no notable dental hx.    Pulmonary former smoker,  breath sounds clear to auscultation  Pulmonary exam normal       Cardiovascular negative cardio ROS      Neuro/Psych negative neurological ROS  negative psych ROS   GI/Hepatic negative GI ROS, Neg liver ROS,   Endo/Other  negative endocrine ROS  Renal/GU negative Renal ROS  negative genitourinary   Musculoskeletal negative musculoskeletal ROS (+)   Abdominal (+) + obese,   Peds  Hematology negative hematology ROS (+)   Anesthesia Other Findings   Reproductive/Obstetrics (+) Pregnancy                           Anesthesia Physical Anesthesia Plan  ASA: II  Anesthesia Plan: Epidural   Post-op Pain Management:    Induction:   Airway Management Planned:   Additional Equipment:   Intra-op Plan:   Post-operative Plan:   Informed Consent: I have reviewed the patients History and Physical, chart, labs and discussed the procedure including the risks, benefits and alternatives for the proposed anesthesia with the patient or authorized representative who has indicated his/her understanding and acceptance.     Plan Discussed with:   Anesthesia Plan Comments:         Anesthesia Quick Evaluation

## 2013-11-18 NOTE — Progress Notes (Signed)
Cindy Sosa is a 23 y.o. G2P1001 at 4675w1d admitted for induction of labor due to twin pregnancy.  Subjective: Pt comfortable and having occassional ctx. FB out  Objective: BP 128/89  Pulse 102  Temp(Src) 98.1 F (36.7 C) (Oral)  Resp 20  Ht 5\' 10"  (1.778 m)  Wt 115.214 kg (254 lb)  BMI 36.45 kg/m2  LMP 02/18/2013      FHT:  FHR: 130s bpm, variability: moderate,  accelerations:  Present,  decelerations:  Absent  FHR: 120s bpm, variability: moderate,  accelerations:  Present,  decelerations:  Absent UC:   irregular, every 3-5 minutes SVE:   Dilation: 5 Effacement (%): 50 Station: -3 Exam by:: Dr. Ike Benedom  Labs: Lab Results  Component Value Date   WBC 9.4 11/17/2013   HGB 10.8* 11/17/2013   HCT 31.6* 11/17/2013   MCV 83.2 11/17/2013   PLT 258 11/17/2013    Assessment / Plan: Induction of labor due to multifetal gestation,  S/p FB now starting pit  Labor: Progressing normally and will continue to increase pitocin as needed Preeclampsia:  no signs or symptoms of toxicity Fetal Wellbeing:  Category I Pain Control:  Epidural and Fentanyl as needed I/D:  GBS Neg Anticipated MOD:  NSVD  Cindy Sosa 11/18/2013, 1:17 AM

## 2013-11-18 NOTE — Progress Notes (Signed)
Cindy Sosa is a 23 y.o. G2P1001 at 10065w1d admitted for induction of labor due to twins.  Subjective: Reporting painful contractions but sleeping through them  Objective: BP 125/72  Pulse 88  Temp(Src) 98 F (36.7 C) (Oral)  Resp 18  Ht 5\' 10"  (1.778 m)  Wt 115.214 kg (254 lb)  BMI 36.45 kg/m2  LMP 02/18/2013     FHTA:  FHR: 120 bpm, variability: moderate,  accelerations:  Present,  decelerations:  Absent FHTB:  FHR: 135 bpm, variability: moderate,  accelerations:  Present,  decelerations:  Present rare early vs. late with poor toco correlation UC:   regular, every 2-3 minutes, not tracing well SVE:   Dilation: 5 Effacement (%): 50 Station: -3 Exam by:: Dr. Lupita ShutterFeeny  Labs: Lab Results  Component Value Date   WBC 9.4 11/17/2013   HGB 10.8* 11/17/2013   HCT 31.6* 11/17/2013   MCV 83.2 11/17/2013   PLT 258 11/17/2013    Assessment / Plan: Induction of labor due to multifetal gestation,  progressing well on pitocin  Labor: Progressing on Pitocin, will continue to increase then AROM Preeclampsia:  no signs or symptoms of toxicity Fetal Wellbeing:  Category I and Category II Pain Control:  Fentanyl I/D:  n/a Anticipated MOD:  NSVD  Beverely Lowdamo, Carey Johndrow 11/18/2013, 9:07 AM

## 2013-11-18 NOTE — Anesthesia Procedure Notes (Signed)
Epidural Patient location during procedure: OB Start time: 11/18/2013 6:24 PM End time: 11/18/2013 6:28 PM  Staffing Anesthesiologist: Leilani AbleHATCHETT, Fronie Holstein Performed by: anesthesiologist   Preanesthetic Checklist Completed: patient identified, surgical consent, pre-op evaluation, timeout performed, IV checked, risks and benefits discussed and monitors and equipment checked  Epidural Patient position: sitting Prep: site prepped and draped and DuraPrep Patient monitoring: continuous pulse ox and blood pressure Approach: midline Injection technique: LOR air  Needle:  Needle type: Tuohy  Needle gauge: 17 G Needle length: 9 cm and 9 Needle insertion depth: 7 cm Catheter type: closed end flexible Catheter size: 19 Gauge Catheter at skin depth: 12 cm Test dose: negative and Other  Assessment Sensory level: T9 Events: blood not aspirated, injection not painful, no injection resistance, negative IV test and no paresthesia  Additional Notes Reason for block:procedure for pain

## 2013-11-18 NOTE — Progress Notes (Signed)
Ralph Dowdyori N Rodriguez is a 23 y.o. G2P1001 at 8055w1d.  Subjective: Not feeling anything w/ UC's  Objective: BP 122/68  Pulse 104  Temp(Src) 98.5 F (36.9 C) (Oral)  Resp 18  Ht 5\' 10"  (1.778 m)  Wt 115.214 kg (254 lb)  BMI 36.45 kg/m2  SpO2 99%  LMP 02/18/2013      FHT:  A: FHR: 125 bpm, variability: moderate,  accelerations:  Present,  decelerations:  Absent, intermittent tracing despite multiple adjustments of EFM. IUPC placed. Tracing well.   B: FHR: 135 bpm, variability: moderate,  accelerations:  Present,  decelerations:  Absent UC:   regular, every 2 minutes, strong SVE:   Dilation: 10 Effacement (%): 100 Station: -1 Exam by:: Dr. Penne LashLeggett Vtx/vtx verified by informal BS US.  Labs: Lab Results  Component Value Date   WBC 10.4 11/18/2013   HGB 10.3* 11/18/2013   HCT 30.5* 11/18/2013   MCV 83.8 11/18/2013   PLT 226 11/18/2013    Assessment / Plan: Induction of labor due to twins,  progressing well on pitocin  Labor: Progressing normally Preeclampsia:  NA Fetal Wellbeing:  Category I Pain Control:  Epidural I/D:  n/a Anticipated MOD:  NSVD Labor down.   Linn Clavin 11/18/2013, 10:10 PM

## 2013-11-18 NOTE — Progress Notes (Addendum)
Ralph Dowdyori N Elling is a 23 y.o. G2P1001 at 3321w1d admitted for induction of labor due to twin pregnancy.  Subjective: Patient resting, feeling some discomfort/pain with contractions and requesting pain medication.  Objective: BP 134/72  Pulse 88  Temp(Src) 98.1 F (36.7 C) (Oral)  Resp 22  Ht 5\' 10"  (1.778 m)  Wt 115.214 kg (254 lb)  BMI 36.45 kg/m2  LMP 02/18/2013      FHRa:  FHR: 130s bpm, variability: moderate,  accelerations:  Present,  decelerations:  Absent  FHRb: 120s bpm, variability: moderate,  accelerations:  Present,  decelerations:  Absent  UC:   irregular, every 2-6 minutes  SVE:   Dilation: 5 Effacement (%): 50 Station: -3 Exam by:: Dr. Lupita ShutterFeeny  Labs: Lab Results  Component Value Date   WBC 9.4 11/17/2013   HGB 10.8* 11/17/2013   HCT 31.6* 11/17/2013   MCV 83.2 11/17/2013   PLT 258 11/17/2013    Assessment / Plan: Induction of labor due to multifetal gestation,  S/p FB, continue pit. No change on cervical exam from prior.  Labor: Progressing normally and will continue to increase pitocin as needed Preeclampsia:  no signs or symptoms of toxicity  Fetal Wellbeing:  Category I Pain Control:  Epidural and Fentanyl as needed I/D:  GBS Neg Anticipated MOD:  NSVD  [name], Patricia L 11/18/2013, 5:31 AM  I spoke with and examined patient and agree with resident's note and plan of care.  Tawana ScaleMichael Ryan Kysean Sweet, MD OB Fellow 11/18/2013 9:04 AM

## 2013-11-18 NOTE — Progress Notes (Signed)
I examined pt and agree with documentation above and resident plan of care. Cindy Sosa,[name]  

## 2013-11-18 NOTE — H&P (Signed)
Attestation of Attending Supervision of Advanced Practitioner (CNM/NP): Evaluation and management procedures were performed by the Advanced Practitioner under my supervision and collaboration.  I have reviewed the Advanced Practitioner's note and chart, and I agree with the management and plan.  Zaila Crew 11/18/2013 7:35 AM

## 2013-11-19 ENCOUNTER — Encounter (HOSPITAL_COMMUNITY): Payer: Self-pay

## 2013-11-19 DIAGNOSIS — O30009 Twin pregnancy, unspecified number of placenta and unspecified number of amniotic sacs, unspecified trimester: Secondary | ICD-10-CM | POA: Diagnosis not present

## 2013-11-19 DIAGNOSIS — O99344 Other mental disorders complicating childbirth: Secondary | ICD-10-CM | POA: Diagnosis not present

## 2013-11-19 DIAGNOSIS — O98519 Other viral diseases complicating pregnancy, unspecified trimester: Secondary | ICD-10-CM | POA: Diagnosis not present

## 2013-11-19 LAB — CBC
HCT: 27.1 % — ABNORMAL LOW (ref 36.0–46.0)
Hemoglobin: 9.2 g/dL — ABNORMAL LOW (ref 12.0–15.0)
MCH: 28.2 pg (ref 26.0–34.0)
MCHC: 33.9 g/dL (ref 30.0–36.0)
MCV: 83.1 fL (ref 78.0–100.0)
PLATELETS: 222 10*3/uL (ref 150–400)
RBC: 3.26 MIL/uL — ABNORMAL LOW (ref 3.87–5.11)
RDW: 14.1 % (ref 11.5–15.5)
WBC: 14.3 10*3/uL — ABNORMAL HIGH (ref 4.0–10.5)

## 2013-11-19 MED ORDER — ONDANSETRON HCL 4 MG/2ML IJ SOLN: 4.0000 mg | INTRAMUSCULAR | Status: DC | PRN

## 2013-11-19 MED ORDER — DIPHENHYDRAMINE HCL 25 MG PO CAPS: 25.0000 mg | ORAL_CAPSULE | Freq: Four times a day (QID) | ORAL | Status: DC | PRN

## 2013-11-19 MED ORDER — TETANUS-DIPHTH-ACELL PERTUSSIS 5-2.5-18.5 LF-MCG/0.5 IM SUSP
0.5000 mL | Freq: Once | INTRAMUSCULAR | Status: AC
Start: 2013-11-20 — End: 2013-11-20
  Administered 2013-11-19: 0.5 mL via INTRAMUSCULAR

## 2013-11-19 MED ORDER — DIBUCAINE 1 % RE OINT: 1.0000 "application " | TOPICAL_OINTMENT | RECTAL | Status: DC | PRN

## 2013-11-19 MED ORDER — BENZOCAINE-MENTHOL 20-0.5 % EX AERO
1.0000 "application " | INHALATION_SPRAY | CUTANEOUS | Status: DC | PRN
  Administered 2013-11-19: 1 via TOPICAL
  Filled 2013-11-19: qty 56

## 2013-11-19 MED ORDER — PNEUMOCOCCAL VAC POLYVALENT 25 MCG/0.5ML IJ INJ
0.5000 mL | INJECTION | INTRAMUSCULAR | Status: AC
  Administered 2013-11-20: 0.5 mL via INTRAMUSCULAR
  Filled 2013-11-19: qty 0.5

## 2013-11-19 MED ORDER — PRENATAL MULTIVITAMIN CH
1.0000 | ORAL_TABLET | Freq: Every day | ORAL | Status: DC
Start: 2013-11-19 — End: 2013-11-20
  Administered 2013-11-19: 1 via ORAL
  Filled 2013-11-19: qty 1

## 2013-11-19 MED ORDER — OXYCODONE-ACETAMINOPHEN 5-325 MG PO TABS: 1.0000 | ORAL_TABLET | ORAL | Status: DC | PRN

## 2013-11-19 MED ORDER — IBUPROFEN 600 MG PO TABS
600.0000 mg | ORAL_TABLET | Freq: Four times a day (QID) | ORAL | Status: DC
Start: 2013-11-19 — End: 2013-11-20
  Administered 2013-11-19 – 2013-11-20 (×5): 600 mg via ORAL
  Filled 2013-11-19 (×5): qty 1

## 2013-11-19 MED ORDER — ZOLPIDEM TARTRATE 5 MG PO TABS: 5.0000 mg | ORAL_TABLET | Freq: Every evening | ORAL | Status: DC | PRN

## 2013-11-19 MED ORDER — LANOLIN HYDROUS EX OINT
TOPICAL_OINTMENT | CUTANEOUS | Status: DC | PRN
Start: 2013-11-19 — End: 2013-11-20

## 2013-11-19 MED ORDER — SIMETHICONE 80 MG PO CHEW: 80.0000 mg | CHEWABLE_TABLET | ORAL | Status: DC | PRN

## 2013-11-19 MED ORDER — SENNOSIDES-DOCUSATE SODIUM 8.6-50 MG PO TABS
2.0000 | ORAL_TABLET | ORAL | Status: DC
  Filled 2013-11-19: qty 2

## 2013-11-19 MED ORDER — ONDANSETRON HCL 4 MG PO TABS: 4.0000 mg | ORAL_TABLET | ORAL | Status: DC | PRN

## 2013-11-19 MED ORDER — WITCH HAZEL-GLYCERIN EX PADS: 1.0000 "application " | MEDICATED_PAD | CUTANEOUS | Status: DC | PRN

## 2013-11-19 NOTE — Progress Notes (Signed)
Ur chart review completed.  

## 2013-11-19 NOTE — Clinical Social Work Maternal (Signed)
    Clinical Social Work Department PSYCHOSOCIAL ASSESSMENT - MATERNAL/CHILD 11/19/2013  Patient:  Cindy Sosa,Cindy Sosa  Account Number:  [id number]  Admit Date:  11/17/2013  Cindy Sosa Name:   Cindy Sosa  Cindy Sosa    Clinical Social Worker:  Nobie PutnamEDRA Clide Remmers, LCSW   Date/Time:  11/19/2013 12:52 PM  Date Referred:  11/19/2013   Referral source  CN     Referred reason  Substance Abuse   Other referral source:    I:  FAMILY / HOME ENVIRONMENT Child's legal guardian:  PARENT  Guardian - Name Guardian - Age Guardian - Address  Cindy Sosa [address] [place], [place] [id number]  Cindy Sosa 39    Other household support members/support persons Name Relationship DOB  Cindy Sosa MOTHER   Cindy Sosa STEPFATHER   Cindy Sosa DAUGHTER 06/03/02   Other support:    II  PSYCHOSOCIAL DATA Information Source:  Patient Interview  Financial and Community Resources Employment:   [employer]er, [employer]   Financial resources:  Medicaid If Medicaid - [place] [facility]  [facility]   School / Grade:   Maternity Care Coordinator / Child Services Coordination / Early Interventions:  Cultural issues impacting care:    III  STRENGTHS Strengths  Adequate Resources  Home prepared for Child (including basic supplies)  Supportive family/friends   Strength comment:    IV  RISK FACTORS AND CURRENT PROBLEMS Current Problem:  YES   Risk Factor & Current Problem Patient Issue Family Issue Risk Factor / Current Problem Comment  Substance Abuse Y Sosa Hx MJ use    V  SOCIAL WORK ASSESSMENT CSW referral received to assess pts history MJ use.  She admits to smoking MJ "everyday" prior to pregnancy at 6-7 weeks.  Pt continued to smoke MJ for "another month or two" to help with nausea before she stopped.  She denies any other illegal substance use & verbalized an understanding of hospital drug testing policy.  UDS collections pending, as well as meconium results.  She denies any history  of depression or SI.  Pt smiled during the assessment & was very polite.  CSW observed pt bonding well with the infant & appears to be doing well at this time.  She identified the FOB & her mother as her primary support system.  CSW discussed PP depression with the pt & encouraged her to seek medical attention if needed.  CSW will continue to monitor drug screen result & make a referral if needed.      VI SOCIAL WORK PLAN Social Work Plan  No Further Intervention Required / No Barriers to Discharge   Type of pt/family education:   If child protective services report - county:   If child protective services report - date:   Information/referral to community resources comment:   Other social work plan:

## 2013-11-20 MED ORDER — NORETHINDRONE 0.35 MG PO TABS: 1.0000 | ORAL_TABLET | Freq: Every day | ORAL | Status: DC

## 2013-11-20 MED ORDER — NORGESTIMATE-ETH ESTRADIOL 0.25-35 MG-MCG PO TABS
1.0000 | ORAL_TABLET | Freq: Every day | ORAL | Status: DC
Start: 2013-11-20 — End: 2016-09-17

## 2013-11-20 NOTE — Discharge Instructions (Signed)

## 2013-11-20 NOTE — Discharge Summary (Signed)
Obstetric Discharge Summary Reason for Admission: induction of labor - twin gestation Prenatal Procedures: none Intrapartum Procedures: spontaneous vaginal delivery and twin delivery Postpartum Procedures: none Complications-Operative and Postpartum: 2nd degree perineal laceration Hemoglobin  Date Value Ref Range Status  11/19/2013 9.2* 12.0 - 15.0 g/dL Final     HCT  Date Value Ref Range Status  11/19/2013 27.1* 36.0 - 46.0 % Final    Physical Exam:  General: alert, cooperative, appears stated age and no distress Lochia: appropriate Uterine Fundus: firm DVT Evaluation: No evidence of DVT seen on physical exam. Negative Homan's sign. No cords or calf tenderness. No significant calf/ankle edema.  Discharge Diagnoses: Term Pregnancy-delivered and Twins  Discharge Information: Date: 11/20/2013 Activity: pelvic rest Diet: routine Medications: PNV Condition: stable Instructions: refer to practice specific booklet Discharge to: home Follow-up Information   Follow up with [facility]. Schedule an appointment as soon as possible for a visit in 4 weeks.   Contact information:   [address] [place] [phone]      Hospital Course Ms. Alan RipperHolloway was admitted for IOL due to twin gestation at 38+1wks.  She progressed without complication. Vertex presentation of both twins was confirmed on bedside ultrasound twice this hospitalization. Twins delivered approximately 50 minutes apart. No complications. Plans to bottle feed. Desires OCPs for contraception and would like to pursue BTL at a later time.      Newborn Data:   Casandra DoffingHolloway, BoyA Jamica [[id number]][[id number]]  Live born female  Birth Weight: 6 lb 10 oz (3005 g) APGAR: 8, 9   Jeanie CooksHolloway, BoyB Aslee [[id number]][[id number]]  Live born female  Birth Weight: 6 lb 4.4 oz (2845 g) APGAR: 8, 9  Home with mother.  [name], Patricia L 11/20/2013, 9:00 AM  I have seen and examined this patient and I agree with the above. Cam HaiSHAW,  KIMBERLY 9:17 AM 11/20/2013

## 2013-11-24 NOTE — Discharge Summary (Signed)
Attestation of Attending Supervision of Advanced Practitioner (CNM/NP): Evaluation and management procedures were performed by the Advanced Practitioner under my supervision and collaboration.  I have reviewed the Advanced Practitioner's note and chart, and I agree with the management and plan.  HARRAWAY-SMITH, Alonzo Owczarzak 11:48 AM     

## 2013-11-27 NOTE — Anesthesia Postprocedure Evaluation (Signed)
Anesthesia Post Note  Patient: Cindy Sosa  Procedure(s) Performed: * No procedures listed *  Anesthesia type: Epidural  Patient location: Mother/Baby  Post pain: Pain level controlled  Post assessment: Post-op Vital signs reviewed  Last Vitals: There were no vitals filed for this visit.  Post vital signs: Reviewed  Level of consciousness: awake  Complications: No apparent anesthesia complications

## 2013-11-27 NOTE — Addendum Note (Signed)
Addendum created 11/27/13 1920 by Leilani AbleFranklin Anamarie Hunn, MD   Modules edited: Notes Section   Notes Section:  File: [id number]

## 2013-12-25 ENCOUNTER — Ambulatory Visit (INDEPENDENT_AMBULATORY_CARE_PROVIDER_SITE_OTHER): Payer: Medicaid Other | Admitting: Advanced Practice Midwife

## 2013-12-25 ENCOUNTER — Encounter: Payer: Self-pay | Admitting: Advanced Practice Midwife

## 2013-12-25 NOTE — Progress Notes (Signed)
  Cindy Dowdyori N Sosa is a 23 y.o. who presents for a postpartum visit. She is 6 weeks postpartum following a spontaneous vaginal delivery. I have fully reviewed the prenatal and intrapartum course. The delivery was at 38 gestational weeks, IOL for twins  Anesthesia: epidural. Postpartum course has been uneventful. Baby's course has been uneventful. Baby is feeding by bottle. Bleeding: period started yesterday. Bowel function is normal. Bladder function is normal. Patient is sexually active. Contraception method is none. Postpartum depression screening: negative.    Review of Systems   Constitutional: Negative for fever and chills Eyes: Negative for visual disturbances Respiratory: Negative for shortness of breath, dyspnea Cardiovascular: Negative for chest pain or palpitations  Gastrointestinal: Negative for vomiting, diarrhea and constipation Genitourinary: Negative for dysuria and urgency Musculoskeletal: Negative for back pain, joint pain, myalgias  Neurological: Negative for dizziness and headaches   Objective:     Filed Vitals:   12/25/13 1034  BP: 120/80   General:  alert, cooperative and no distress   Breasts:  negative  Lungs: clear to auscultation bilaterally  Heart:  regular rate and rhythm  Abdomen: Soft, nontender   Vulva:  normal  Vagina: normal vagina  Cervix:  closed  Corpus: Well involuted     Rectal Exam: no hemorrhoids        Assessment:    normal postpartum exam.  Plan:    1. Contraception: OCP (estrogen/progesterone)  Has rx, start today with b/u for 3 weeks 2. Follow up in: prn

## 2014-08-10 ENCOUNTER — Encounter: Payer: Self-pay | Admitting: Advanced Practice Midwife

## 2016-09-17 ENCOUNTER — Emergency Department (HOSPITAL_COMMUNITY)
Admission: EM | Admit: 2016-09-17 | Discharge: 2016-09-17 | Disposition: A | Discharge status: Home / Self Care | Payer: Self-pay | Attending: Emergency Medicine | Admitting: Emergency Medicine

## 2016-09-17 ENCOUNTER — Emergency Department (HOSPITAL_COMMUNITY): Payer: Self-pay

## 2016-09-17 ENCOUNTER — Encounter (HOSPITAL_COMMUNITY): Payer: Self-pay | Admitting: Emergency Medicine

## 2016-09-17 DIAGNOSIS — F17210 Nicotine dependence, cigarettes, uncomplicated: Secondary | ICD-10-CM | POA: Insufficient documentation

## 2016-09-17 DIAGNOSIS — J3489 Other specified disorders of nose and nasal sinuses: Secondary | ICD-10-CM | POA: Insufficient documentation

## 2016-09-17 DIAGNOSIS — Z5181 Encounter for therapeutic drug level monitoring: Secondary | ICD-10-CM | POA: Insufficient documentation

## 2016-09-17 DIAGNOSIS — R05 Cough: Secondary | ICD-10-CM | POA: Insufficient documentation

## 2016-09-17 DIAGNOSIS — R112 Nausea with vomiting, unspecified: Secondary | ICD-10-CM | POA: Insufficient documentation

## 2016-09-17 LAB — RAPID URINE DRUG SCREEN, HOSP PERFORMED
Amphetamines: NOT DETECTED
BARBITURATES: NOT DETECTED
Benzodiazepines: NOT DETECTED
Cocaine: NOT DETECTED
OPIATES: NOT DETECTED
TETRAHYDROCANNABINOL: POSITIVE — AB

## 2016-09-17 LAB — DIFFERENTIAL
BASOS ABS: 0 10*3/uL (ref 0.0–0.1)
BASOS PCT: 0 %
Eosinophils Absolute: 0 10*3/uL (ref 0.0–0.7)
Eosinophils Relative: 0 %
Lymphocytes Relative: 20 %
Lymphs Abs: 3.1 10*3/uL (ref 0.7–4.0)
Monocytes Absolute: 0.8 10*3/uL (ref 0.1–1.0)
Monocytes Relative: 5 %
NEUTROS ABS: 11.5 10*3/uL — AB (ref 1.7–7.7)
Neutrophils Relative %: 75 %

## 2016-09-17 LAB — CBC
HCT: 41.1 % (ref 36.0–46.0)
Hemoglobin: 14.1 g/dL (ref 12.0–15.0)
MCH: 30.3 pg (ref 26.0–34.0)
MCHC: 34.3 g/dL (ref 30.0–36.0)
MCV: 88.2 fL (ref 78.0–100.0)
Platelets: 321 10*3/uL (ref 150–400)
RBC: 4.66 MIL/uL (ref 3.87–5.11)
RDW: 14.3 % (ref 11.5–15.5)
WBC: 15.6 10*3/uL — ABNORMAL HIGH (ref 4.0–10.5)

## 2016-09-17 LAB — URINALYSIS, ROUTINE W REFLEX MICROSCOPIC
Bacteria, UA: NONE SEEN
Bilirubin Urine: NEGATIVE
Glucose, UA: NEGATIVE mg/dL
Hgb urine dipstick: NEGATIVE
Ketones, ur: 5 mg/dL — AB
Leukocytes, UA: NEGATIVE
Nitrite: NEGATIVE
PH: 6 (ref 5.0–8.0)
PROTEIN: 30 mg/dL — AB
Specific Gravity, Urine: 1.023 (ref 1.005–1.030)

## 2016-09-17 LAB — COMPREHENSIVE METABOLIC PANEL
ALBUMIN: 4 g/dL (ref 3.5–5.0)
ALT: 15 U/L (ref 14–54)
AST: 22 U/L (ref 15–41)
Alkaline Phosphatase: 71 U/L (ref 38–126)
Anion gap: 10 (ref 5–15)
BUN: 8 mg/dL (ref 6–20)
CHLORIDE: 108 mmol/L (ref 101–111)
CO2: 25 mmol/L (ref 22–32)
Calcium: 9.2 mg/dL (ref 8.9–10.3)
Creatinine, Ser: 0.77 mg/dL (ref 0.44–1.00)
GFR calc Af Amer: >60 mL/min (ref >60)
GFR calc non Af Amer: >60 mL/min (ref >60)
Glucose, Bld: 102 mg/dL — ABNORMAL HIGH (ref 65–99)
POTASSIUM: 3.3 mmol/L — AB (ref 3.5–5.1)
SODIUM: 143 mmol/L (ref 135–145)
Total Bilirubin: 0.6 mg/dL (ref 0.3–1.2)
Total Protein: 8.1 g/dL (ref 6.5–8.1)

## 2016-09-17 LAB — PREGNANCY, URINE: Preg Test, Ur: NEGATIVE

## 2016-09-17 LAB — INFLUENZA PANEL BY PCR (TYPE A & B)
Influenza A By PCR: NEGATIVE
Influenza B By PCR: NEGATIVE

## 2016-09-17 LAB — LIPASE, BLOOD: Lipase: 18 U/L (ref 11–51)

## 2016-09-17 LAB — CBG MONITORING, ED
GLUCOSE-CAPILLARY: 91 mg/dL (ref 65–99)
GLUCOSE-CAPILLARY: 105 mg/dL — AB (ref 65–99)

## 2016-09-17 MED ORDER — ONDANSETRON HCL 4 MG/2ML IJ SOLN
4.0000 mg | INTRAMUSCULAR | Status: AC | PRN
  Administered 2016-09-17 (×2): 4 mg via INTRAVENOUS
  Filled 2016-09-17 (×2): qty 2

## 2016-09-17 MED ORDER — ONDANSETRON HCL 4 MG PO TABS: 4.0000 mg | ORAL_TABLET | Freq: Three times a day (TID) | ORAL | 0 refills | Status: DC | PRN

## 2016-09-17 MED ORDER — FAMOTIDINE IN NACL 20-0.9 MG/50ML-% IV SOLN
20.0000 mg | Freq: Once | INTRAVENOUS | Status: AC
  Administered 2016-09-17: 20 mg via INTRAVENOUS
  Filled 2016-09-17: qty 50

## 2016-09-17 MED ORDER — SODIUM CHLORIDE 0.9 % IV BOLUS (SEPSIS)
1000.0000 mL | Freq: Once | INTRAVENOUS | Status: AC
  Administered 2016-09-17: 1000 mL via INTRAVENOUS

## 2016-09-17 MED ORDER — POTASSIUM CHLORIDE CRYS ER 20 MEQ PO TBCR
40.0000 meq | EXTENDED_RELEASE_TABLET | Freq: Once | ORAL | Status: AC
  Administered 2016-09-17: 40 meq via ORAL
  Filled 2016-09-17: qty 2

## 2016-09-17 NOTE — ED Notes (Signed)
Pt denies recent stoppage of any meds- family at bedside

## 2016-09-17 NOTE — ED Provider Notes (Signed)
[facility]-EM[facility]ENCY DEPT Provider Note   CSN: [id number] Arrival date & time: 09/17/16  1751     History   Chief Complaint Chief Complaint  Patient presents with  . Emesis    HPI Cindy Sosa N Tupper is a 25 y.o. female.  HPI  Pt was seen at 1845. Per pt and her mother, c/o gradual onset and persistence of multiple intermittent episodes of N/V that began this morning. Has been associated with cough, "chills" and "sweating." Denies any other complaints. Denies CP/SOB, no abd pain, no diarrhea, no black or blood in stools or emesis, no back pain, no neck pain, no objective fevers, no sore throat, no rash.   Past Medical History:  Diagnosis Date  . Medical history non-contributory     Patient Active Problem List   Diagnosis Date Noted  . 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 NO PAST SURGERIES      OB History    Gravida Para Term Preterm AB Living   2 2 2     3    SAB TAB Ectopic Multiple Live Births         1 3       Home Medications    Prior to Admission medications   Not on File    Family History Family History  Problem Relation Age of Onset  . Hypertension Paternal Grandmother   . Hypertension Paternal Aunt     Social History Social History  Substance Use Topics  . Smoking status: Current Every Day Smoker    Packs/day: 0.50    Types: Cigarettes  . Smokeless tobacco: Never Used  . Alcohol use No     Allergies   Patient has no known allergies.   Review of Systems Review of Systems ROS: Statement: All systems negative except as marked or noted in the HPI; Constitutional: Negative for objective fever and +"sweats and chills." ; ; Eyes: Negative for eye pain, redness and discharge. ; ; ENMT: Negative for ear pain, hoarseness, nasal congestion, sinus pressure and sore throat. ; ; Cardiovascular: Negative for chest pain, palpitations, diaphoresis, dyspnea and peripheral edema. ; ;  Respiratory: +cough. Negative for wheezing and stridor. ; ; Gastrointestinal: +N/V. Negative for diarrhea, abdominal pain, blood in stool, hematemesis, jaundice and rectal bleeding. . ; ; Genitourinary: Negative for dysuria, flank pain and hematuria. ; ; Musculoskeletal: Negative for back pain and neck pain. Negative for swelling and trauma.; ; Skin: Negative for pruritus, rash, abrasions, blisters, bruising and skin lesion.; ; Neuro: Negative for headache, lightheadedness and neck stiffness. Negative for weakness, altered level of consciousness, altered mental status, extremity weakness, paresthesias, involuntary movement, seizure and syncope.       Physical Exam Updated Vital Signs BP 120/98   Pulse 70   Temp 98.2 F (36.8 C) (Rectal)   Resp 20   Ht 5\' 10"  (1.778 m)   Wt 185 lb (83.9 kg)   LMP 08/18/2016   SpO2 100%   BMI 26.54 kg/m    Patient Vitals for the past 24 hrs:  BP Temp Temp src Pulse Resp SpO2 Height Weight  09/17/16 1900 120/98 - - 70 - 100 % - -  09/17/16 1858 - 98.2 F (36.8 C) Rectal - - - - -  09/17/16 1830 125/90 - - 74 - 100 % - -  09/17/16 1806 113/97 98.2 F (36.8 C) Oral 65 20 100 % 5\' 10"  (1.778 m) 185 lb (83.9  kg)      Physical Exam 1850: Physical examination:  Nursing notes reviewed; Vital signs and O2 SAT reviewed;  Constitutional: Well developed, Well nourished, Well hydrated, In no acute distress; Head:  Normocephalic, atraumatic; Eyes: EOMI, PERRL, No scleral icterus; ENMT: TM's clear bilat. +edemetous nasal turbinates bilat with clear rhinorrhea. Mouth and pharynx without lesions. No tonsillar exudates. No intra-oral edema. No submandibular or sublingual edema. No hoarse voice, no drooling, no stridor. No pain with manipulation of larynx. No trismus. Mouth and pharynx normal, Mucous membranes moist; Neck: Supple, Full range of motion, No lymphadenopathy. No meningeal signs.; Cardiovascular: Regular rate and rhythm, No murmur, rub, or gallop;  Respiratory: Breath sounds clear & equal bilaterally, No rales, rhonchi, wheezes.  Speaking full sentences with ease, Normal respiratory effort/excursion; Chest: Nontender, Movement normal; Abdomen: Soft, Nontender, Nondistended, Normal bowel sounds; Genitourinary: No CVA tenderness; Extremities: Pulses normal, No tenderness, No edema, No calf edema or asymmetry.; Neuro: AA&Ox3, Major CN grossly intact.  Speech clear. No gross focal motor or sensory deficits in extremities.; Skin: Color normal, Warm, moist.   ED Treatments / Results  Labs (all labs ordered are listed, but only abnormal results are displayed)   EKG  EKG Interpretation None       Radiology   Procedures Procedures (including critical care time)  Medications Ordered in ED Medications  sodium chloride 0.9 % bolus 1,000 mL (not administered)  ondansetron (ZOFRAN) injection 4 mg (not administered)  famotidine (PEPCID) IVPB 20 mg premix (not administered)     Initial Impression / Assessment and Plan / ED Course  I have reviewed the triage vital signs and the nursing notes.  Pertinent labs & imaging results that were available during my care of the patient were reviewed by me and considered in my medical decision making (see chart for details).  MDM Reviewed: previous chart, nursing note and vitals Reviewed previous: labs Interpretation: labs and x-ray   Results for orders placed or performed during the hospital encounter of 09/17/16  Lipase, blood  Result Value Ref Range   Lipase 18 11 - 51 U/L  Comprehensive metabolic panel  Result Value Ref Range   Sodium 143 135 - 145 mmol/L   Potassium 3.3 (L) 3.5 - 5.1 mmol/L   Chloride 108 101 - 111 mmol/L   CO2 25 22 - 32 mmol/L   Glucose, Bld 102 (H) 65 - 99 mg/dL   BUN 8 6 - 20 mg/dL   Creatinine, Ser 0.860.77 0.44 - 1.00 mg/dL   Calcium 9.2 8.9 - 57.810.3 mg/dL   Total Protein 8.1 6.5 - 8.1 g/dL   Albumin 4.0 3.5 - 5.0 g/dL   AST 22 15 - 41 U/L   ALT 15 14 - 54 U/L     Alkaline Phosphatase 71 38 - 126 U/L   Total Bilirubin 0.6 0.3 - 1.2 mg/dL   GFR calc non Af Amer >60 >60 mL/min   GFR calc Af Amer >60 >60 mL/min   Anion gap 10 5 - 15  CBC  Result Value Ref Range   WBC 15.6 (H) 4.0 - 10.5 K/uL   RBC 4.66 3.87 - 5.11 MIL/uL   Hemoglobin 14.1 12.0 - 15.0 g/dL   HCT 46.941.1 62.936.0 - 52.846.0 %   MCV 88.2 78.0 - 100.0 fL   MCH 30.3 26.0 - 34.0 pg   MCHC 34.3 30.0 - 36.0 g/dL   RDW 41.314.3 24.411.5 - 01.015.5 %   Platelets 321 150 - 400 K/uL  Urinalysis, Routine w  reflex microscopic  Result Value Ref Range   Color, Urine YELLOW YELLOW   APPearance HAZY (A) CLEAR   Specific Gravity, Urine 1.023 1.005 - 1.030   pH 6.0 5.0 - 8.0   Glucose, UA NEGATIVE NEGATIVE mg/dL   Hgb urine dipstick NEGATIVE NEGATIVE   Bilirubin Urine NEGATIVE NEGATIVE   Ketones, ur 5 (A) NEGATIVE mg/dL   Protein, ur 30 (A) NEGATIVE mg/dL   Nitrite NEGATIVE NEGATIVE   Leukocytes, UA NEGATIVE NEGATIVE   RBC / HPF 0-5 0 - 5 RBC/hpf   WBC, UA 0-5 0 - 5 WBC/hpf   Bacteria, UA NONE SEEN NONE SEEN   Mucous PRESENT   Pregnancy, urine  Result Value Ref Range   Preg Test, Ur NEGATIVE NEGATIVE  Influenza panel by PCR (type A & B, H1N1)  Result Value Ref Range   Influenza A By PCR NEGATIVE NEGATIVE   Influenza B By PCR NEGATIVE NEGATIVE  Rapid urine drug screen (hospital performed)  Result Value Ref Range   Opiates NONE DETECTED NONE DETECTED   Cocaine NONE DETECTED NONE DETECTED   Benzodiazepines NONE DETECTED NONE DETECTED   Amphetamines NONE DETECTED NONE DETECTED   Tetrahydrocannabinol POSITIVE (A) NONE DETECTED   Barbiturates NONE DETECTED NONE DETECTED  Differential  Result Value Ref Range   Neutrophils Relative % 75 %   Neutro Abs 11.5 (H) 1.7 - 7.7 K/uL   Lymphocytes Relative 20 %   Lymphs Abs 3.1 0.7 - 4.0 K/uL   Monocytes Relative 5 %   Monocytes Absolute 0.8 0.1 - 1.0 K/uL   Eosinophils Relative 0 %   Eosinophils Absolute 0.0 0.0 - 0.7 K/uL   Basophils Relative 0 %   Basophils  Absolute 0.0 0.0 - 0.1 K/uL  POC CBG, ED  Result Value Ref Range   Glucose-Capillary 91 65 - 99 mg/dL  CBG monitoring, ED  Result Value Ref Range   Glucose-Capillary 105 (H) 65 - 99 mg/dL   Dg Chest 2 View Result Date: 09/17/2016 CLINICAL DATA:  Nausea and vomiting along with diaphoresis, shaking, and chills starting today. Patient reports buttock abscess as well. EXAM: CHEST  2 VIEW COMPARISON:  12/23/2004 FINDINGS: The heart size and mediastinal contours are within normal limits. Both lungs are clear. The visualized skeletal structures are unremarkable. IMPRESSION: No active cardiopulmonary disease. Electronically Signed   By: Gaylyn Rong M.D.   On: 09/17/2016 19:55    2315:  Potassium repleted PO. Pt has tol PO well while in the ED without N/V.  No stooling while in the ED.  Abd remains benign, VSS. Feels better and wants to go home now. Tx symptomatically at this time. Dx and testing d/w pt and family.  Questions answered.  Verb understanding, agreeable to d/c home with outpt f/u.   Final Clinical Impressions(s) / ED Diagnoses   Final diagnoses:  None    New Prescriptions New Prescriptions   No medications on file     Samuel Jester, DO 09/21/16 2057

## 2016-09-17 NOTE — ED Notes (Signed)
Dr Clarene DukeMcManus in to reassess and speak with family

## 2016-09-17 NOTE — ED Notes (Addendum)
Pt sipping sprite zero without any reoccurrence of emesis.

## 2016-09-17 NOTE — ED Notes (Signed)
Pt vomiting. PO fluid challenge failed.

## 2016-09-17 NOTE — ED Notes (Signed)
Pt vomiting as staff went to offer pos

## 2016-09-17 NOTE — ED Triage Notes (Signed)
PT presents to triage with severe diaphoresis and wet clothing with shaking and chills, n/v that started this evening. PT also states abscess to buttock x1 week.

## 2016-09-17 NOTE — Discharge Instructions (Signed)
Take the prescription as directed.  Increase your fluid intake (ie:  Gatoraide) for the next few days, as discussed.  Eat a bland diet and advance to your regular diet slowly as you can tolerate it. Call your regular medical doctor tomorrow to schedule a follow up appointment in the next 2 to 3 days.  Return to the Emergency Department immediately if not improving (or even worsening) despite taking the medicines as prescribed, any black or bloody stool or vomit, if you develop a fever over "101," or for any other concerns.

## 2016-09-17 NOTE — ED Notes (Signed)
Awaiting disposition- reports feeling better-  Informed of delay

## 2017-12-28 IMAGING — DX DG CHEST 2V
2 series · 2 of 2 positions shown · non-contrast
Comparison: 12/23/2004

CLINICAL DATA: Nausea and vomiting along with diaphoresis, shaking,
and chills starting today. Patient reports buttock abscess as well.

EXAM:
CHEST  2 VIEW

[chest pa]
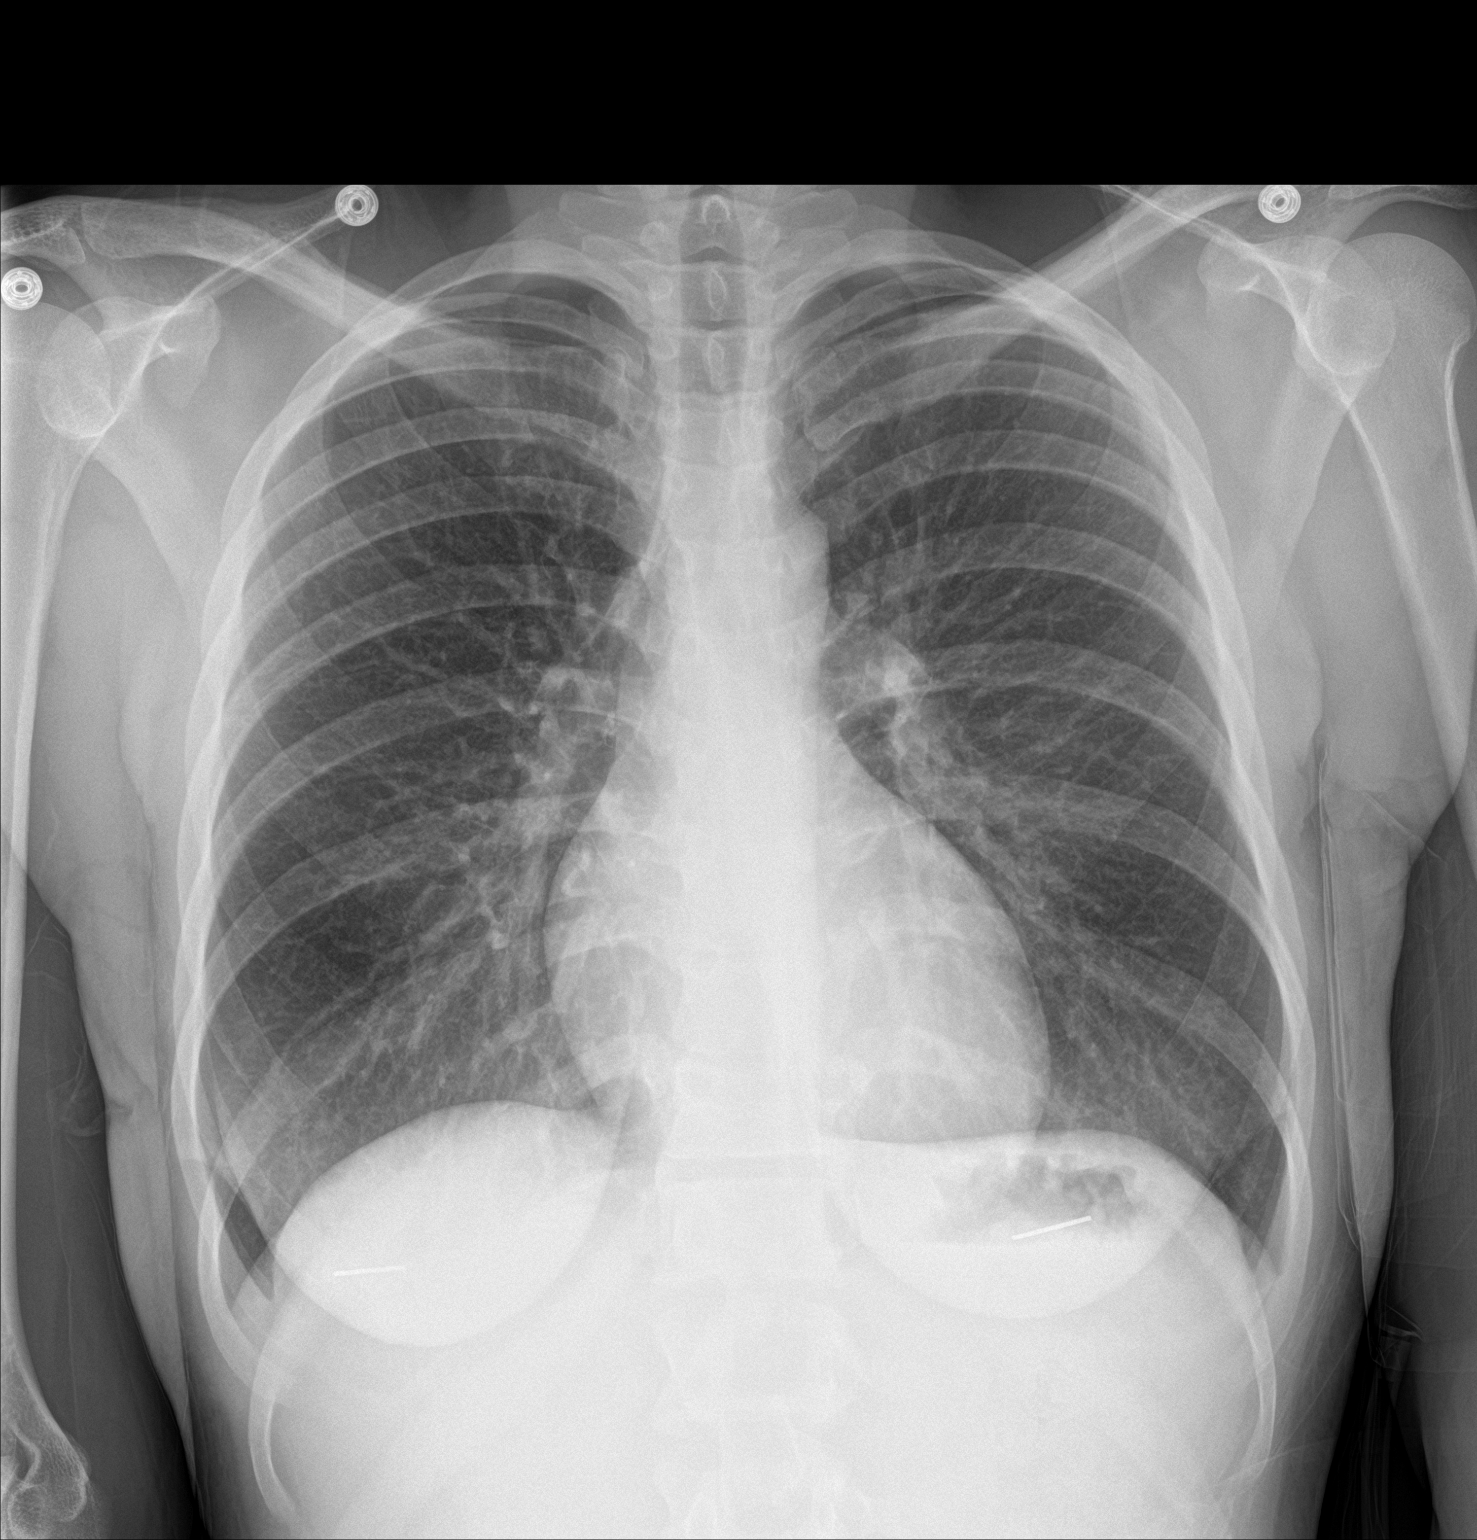

[chest lat]
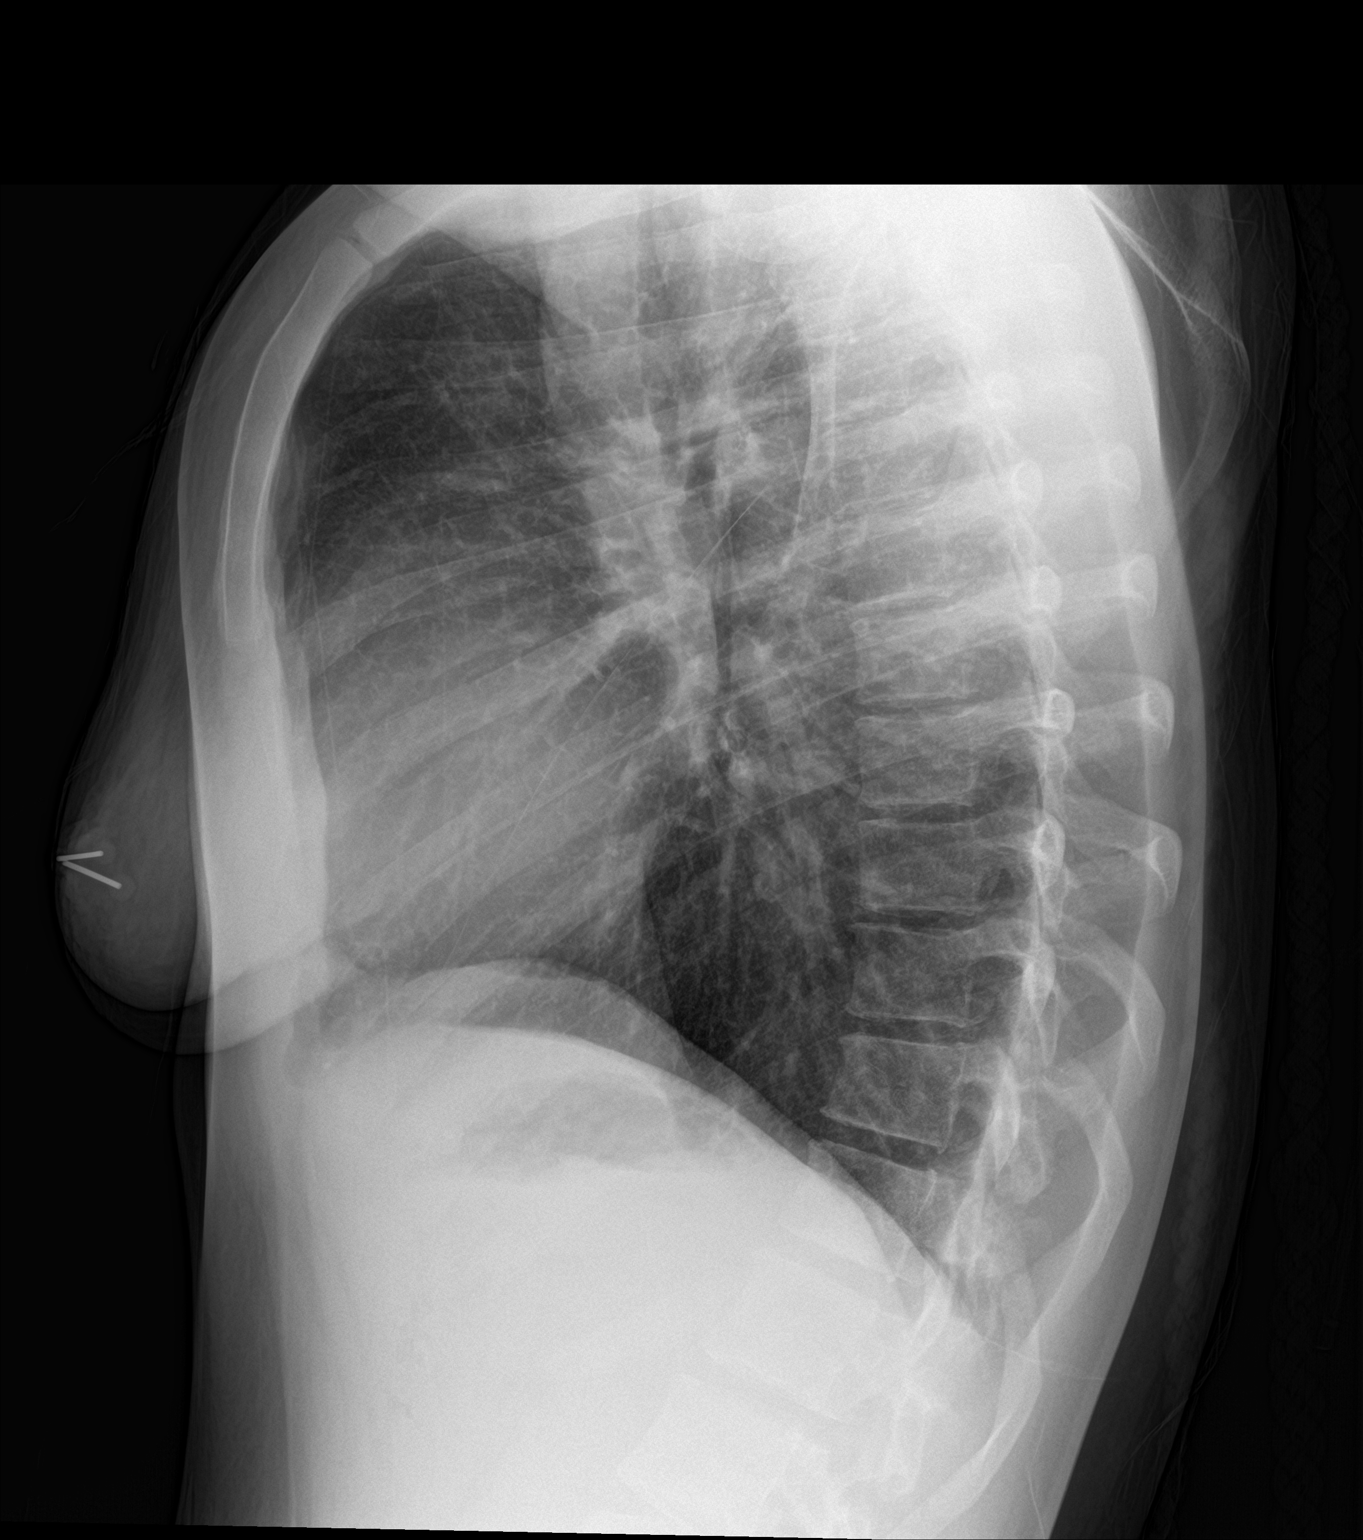

[2 of 2 positions shown; findings below may reference images not displayed]

FINDINGS: The heart size and mediastinal contours are within normal limits.
Both lungs are clear. The visualized skeletal structures are
unremarkable.
IMPRESSION: No active cardiopulmonary disease.

## 2020-04-24 ENCOUNTER — Encounter (HOSPITAL_COMMUNITY): Payer: Self-pay | Admitting: Emergency Medicine

## 2020-04-24 ENCOUNTER — Other Ambulatory Visit: Payer: Self-pay

## 2020-04-24 ENCOUNTER — Emergency Department (HOSPITAL_COMMUNITY)
Admission: EM | Admit: 2020-04-24 | Discharge: 2020-04-24 | Disposition: A | Discharge status: Home / Self Care | Payer: 59 | Attending: Emergency Medicine | Admitting: Emergency Medicine

## 2020-04-24 DIAGNOSIS — K529 Noninfective gastroenteritis and colitis, unspecified: Secondary | ICD-10-CM | POA: Insufficient documentation

## 2020-04-24 DIAGNOSIS — E876 Hypokalemia: Secondary | ICD-10-CM | POA: Diagnosis not present

## 2020-04-24 DIAGNOSIS — R8290 Unspecified abnormal findings in urine: Secondary | ICD-10-CM | POA: Insufficient documentation

## 2020-04-24 DIAGNOSIS — R112 Nausea with vomiting, unspecified: Secondary | ICD-10-CM | POA: Diagnosis present

## 2020-04-24 DIAGNOSIS — F17210 Nicotine dependence, cigarettes, uncomplicated: Secondary | ICD-10-CM | POA: Diagnosis not present

## 2020-04-24 LAB — COMPREHENSIVE METABOLIC PANEL
ALT: 20 U/L (ref 0–44)
AST: 23 U/L (ref 15–41)
Albumin: 4.2 g/dL (ref 3.5–5.0)
Alkaline Phosphatase: 90 U/L (ref 38–126)
Anion gap: 12 (ref 5–15)
BUN: 10 mg/dL (ref 6–20)
CO2: 23 mmol/L (ref 22–32)
Calcium: 9.3 mg/dL (ref 8.9–10.3)
Chloride: 102 mmol/L (ref 98–111)
Creatinine, Ser: 0.81 mg/dL (ref 0.44–1.00)
GFR calc Af Amer: >60 mL/min (ref >60)
GFR calc non Af Amer: >60 mL/min (ref >60)
Glucose, Bld: 107 mg/dL — ABNORMAL HIGH (ref 70–99)
Potassium: 2.8 mmol/L — ABNORMAL LOW (ref 3.5–5.1)
Sodium: 137 mmol/L (ref 135–145)
Total Bilirubin: 1 mg/dL (ref 0.3–1.2)
Total Protein: 8.3 g/dL — ABNORMAL HIGH (ref 6.5–8.1)

## 2020-04-24 LAB — PREGNANCY, URINE: Preg Test, Ur: NEGATIVE

## 2020-04-24 LAB — CBC
HCT: 43.1 % (ref 36.0–46.0)
Hemoglobin: 14.6 g/dL (ref 12.0–15.0)
MCH: 29.2 pg (ref 26.0–34.0)
MCHC: 33.9 g/dL (ref 30.0–36.0)
MCV: 86.2 fL (ref 80.0–100.0)
Platelets: 358 10*3/uL (ref 150–400)
RBC: 5 MIL/uL (ref 3.87–5.11)
RDW: 13.9 % (ref 11.5–15.5)
WBC: 14.4 10*3/uL — ABNORMAL HIGH (ref 4.0–10.5)
nRBC: 0 % (ref 0.0–0.2)

## 2020-04-24 LAB — URINALYSIS, ROUTINE W REFLEX MICROSCOPIC
Glucose, UA: NEGATIVE mg/dL
Hgb urine dipstick: NEGATIVE
Ketones, ur: 5 mg/dL — AB
Nitrite: NEGATIVE
Protein, ur: 100 mg/dL — AB
Specific Gravity, Urine: 1.034 — ABNORMAL HIGH (ref 1.005–1.030)
WBC, UA: >50 WBC/hpf — ABNORMAL HIGH (ref 0–5)
pH: 6 (ref 5.0–8.0)

## 2020-04-24 LAB — LIPASE, BLOOD: Lipase: 21 U/L (ref 11–51)

## 2020-04-24 MED ORDER — ONDANSETRON 4 MG PO TBDP: 4.0000 mg | ORAL_TABLET | Freq: Three times a day (TID) | ORAL | 0 refills | Status: AC | PRN

## 2020-04-24 MED ORDER — SODIUM CHLORIDE 0.9% FLUSH: 3.0000 mL | Freq: Once | INTRAVENOUS | Status: DC

## 2020-04-24 MED ORDER — MAGNESIUM SULFATE 2 GM/50ML IV SOLN
2.0000 g | INTRAVENOUS | Status: AC
  Administered 2020-04-24: 14:00:00 2 g via INTRAVENOUS
  Filled 2020-04-24: qty 50

## 2020-04-24 MED ORDER — POTASSIUM CHLORIDE 10 MEQ/100ML IV SOLN
10.0000 meq | Freq: Once | INTRAVENOUS | Status: AC
  Administered 2020-04-24: 14:00:00 10 meq via INTRAVENOUS
  Filled 2020-04-24: qty 100

## 2020-04-24 MED ORDER — POTASSIUM CHLORIDE CRYS ER 20 MEQ PO TBCR
40.0000 meq | EXTENDED_RELEASE_TABLET | Freq: Once | ORAL | Status: AC
  Administered 2020-04-24: 13:00:00 40 meq via ORAL
  Filled 2020-04-24: qty 2

## 2020-04-24 MED ORDER — ONDANSETRON HCL 4 MG/2ML IJ SOLN
4.0000 mg | Freq: Once | INTRAMUSCULAR | Status: AC
  Administered 2020-04-24: 13:00:00 4 mg via INTRAVENOUS
  Filled 2020-04-24: qty 2

## 2020-04-24 MED ORDER — SODIUM CHLORIDE 0.9 % IV BOLUS
1000.0000 mL | Freq: Once | INTRAVENOUS | Status: AC
  Administered 2020-04-24: 13:00:00 1000 mL via INTRAVENOUS

## 2020-04-24 MED ORDER — SODIUM CHLORIDE 0.9 % IV SOLN
1.0000 g | Freq: Once | INTRAVENOUS | Status: AC
  Administered 2020-04-24: 13:00:00 1 g via INTRAVENOUS
  Filled 2020-04-24: qty 10

## 2020-04-24 NOTE — ED Provider Notes (Signed)
[facility] EMERGENCY DEPARTMENT Provider Note   CSN: [id number] Arrival date & time: 04/24/20  1032     History Chief Complaint  Patient presents with  . Abdominal Pain    Cindy Sosa is a 29 y.o. female.  HPI   29 year old female, she has a history of no significant chronic medical conditions, she also has no prior history of abdominal surgery.  The patient reports that she over the last 48 hours has developed multiple episodes of nausea and vomiting.  She has not had any fevers or chills.  She is now feeling like she has some shakes and shivers.  This all started approximately 1 hour after eating a warmed up hamburger that she had made the night before.  She denies any focal abdominal tenderness but has diffuse abdominal cramping from multiple episodes of vomiting.  There is no blood in the stools, she did take a laxative thinking that her stomach upset was because constipation, she has since had some diarrhea.  Nobody else has been sick around her, no recent travel.  Past Medical History:  Diagnosis Date  . Medical history non-contributory     Patient Active Problem List   Diagnosis Date Noted  . 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 NO PAST SURGERIES       OB History    Gravida  3   Para  3   Term  3   Preterm      AB      Living  3     SAB      TAB      Ectopic      Multiple  1   Live Births  3           Family History  Problem Relation Age of Onset  . Hypertension Paternal Grandmother   . Hypertension Paternal Aunt     Social History   Tobacco Use  . Smoking status: Current Every Day Smoker    Packs/day: 0.50    Types: Cigarettes  . Smokeless tobacco: Never Used  Vaping Use  . Vaping Use: Never used  Substance Use Topics  . Alcohol use: No  . Drug use: Yes    Types: Marijuana    Comment: none in 1 week; not now    Home Medications Prior to  Admission medications   Medication Sig Start Date End Date Taking? Authorizing Provider  ondansetron (ZOFRAN ODT) 4 MG disintegrating tablet Take 1 tablet (4 mg total) by mouth every 8 (eight) hours as needed for nausea. 04/24/20   Eber Hong, MD    Allergies    Patient has no known allergies.  Review of Systems   Review of Systems  All other systems reviewed and are negative.   Physical Exam Updated Vital Signs BP 116/85 (BP Location: Right Arm)   Pulse 80   Temp 98.7 F (37.1 C) (Oral)   Resp 16   Ht 1.829 m (6')   Wt 113.4 kg   LMP 04/19/2020   SpO2 99%   BMI 33.91 kg/m   Physical Exam Vitals and nursing note reviewed.  Constitutional:      General: She is not in acute distress.    Appearance: She is well-developed.  HENT:     Head: Normocephalic and atraumatic.     Mouth/Throat:     Pharynx: No oropharyngeal exudate.  Eyes:     General: No  scleral icterus.       Right eye: No discharge.        Left eye: No discharge.     Conjunctiva/sclera: Conjunctivae normal.     Pupils: Pupils are equal, round, and reactive to light.  Neck:     Thyroid: No thyromegaly.     Vascular: No JVD.  Cardiovascular:     Rate and Rhythm: Normal rate and regular rhythm.     Heart sounds: Normal heart sounds. No murmur heard.  No friction rub. No gallop.   Pulmonary:     Effort: Pulmonary effort is normal. No respiratory distress.     Breath sounds: Normal breath sounds. No wheezing or rales.  Abdominal:     General: Bowel sounds are normal. There is no distension.     Palpations: Abdomen is soft. There is no mass.     Tenderness: There is abdominal tenderness.     Comments: Mild diffuse abdominal tenderness, no guarding, no peritoneal signs, very soft  Musculoskeletal:        General: No tenderness. Normal range of motion.     Cervical back: Normal range of motion and neck supple.  Lymphadenopathy:     Cervical: No cervical adenopathy.  Skin:    General: Skin is warm and  dry.     Findings: No erythema or rash.  Neurological:     Mental Status: She is alert.     Coordination: Coordination normal.  Psychiatric:        Behavior: Behavior normal.     ED Results / Procedures / Treatments   Labs (all labs ordered are listed, but only abnormal results are displayed) Labs Reviewed  COMPREHENSIVE METABOLIC PANEL - Abnormal; Notable for the following components:      Result Value   Potassium 2.8 (*)    Glucose, Bld 107 (*)    Total Protein 8.3 (*)    All other components within normal limits  CBC - Abnormal; Notable for the following components:   WBC 14.4 (*)    All other components within normal limits  URINALYSIS, ROUTINE W REFLEX MICROSCOPIC - Abnormal; Notable for the following components:   Color, Urine AMBER (*)    APPearance CLOUDY (*)    Specific Gravity, Urine 1.034 (*)    Bilirubin Urine MODERATE (*)    Ketones, ur 5 (*)    Protein, ur 100 (*)    Leukocytes,Ua MODERATE (*)    WBC, UA >50 (*)    Bacteria, UA RARE (*)    All other components within normal limits  URINE CULTURE  LIPASE, BLOOD  PREGNANCY, URINE    EKG None  Radiology No results found.  Procedures Procedures (including critical care time)  Medications Ordered in ED Medications  sodium chloride flush (NS) 0.9 % injection 3 mL (has no administration in time range)  cefTRIAXone (ROCEPHIN) 1 g in sodium chloride 0.9 % 100 mL IVPB (0 g Intravenous Stopped 04/24/20 1358)  sodium chloride 0.9 % bolus 1,000 mL (0 mLs Intravenous Stopped 04/24/20 1358)  ondansetron (ZOFRAN) injection 4 mg (4 mg Intravenous Given 04/24/20 1317)  magnesium sulfate IVPB 2 g 50 mL (2 g Intravenous New Bag/Given 04/24/20 1358)  potassium chloride 10 mEq in 100 mL IVPB (0 mEq Intravenous Stopped 04/24/20 1456)  potassium chloride SA (KLOR-CON) CR tablet 40 mEq (40 mEq Oral Given 04/24/20 1316)    ED Course  I have reviewed the triage vital signs and the nursing notes.  Pertinent labs & imaging  results that were available during my care of the patient were reviewed by me and considered in my medical decision making (see chart for details).    MDM Rules/Calculators/A&P                          This patient symptoms are consistent with a possible gastroenteritis.  She has no Murphy sign, no significant alcohol intake or history of pancreatitis, labs are unremarkable except for a white count of 14,000 and a potassium of 2.8.  This seems reactive, she does not have a surgical abdomen nor does she have a history that would be suggestive of a focal pathology given her nonfocal abdominal tenderness.  Will replace potassium, magnesium, she will get IV fluids and antiemetics.  I do not think she needs advanced imaging at this time.  Potassium replaced, magnesium given, the patient was given a liter of IV fluids and has done very well.  She is no longer nauseated, tolerated oral potassium and is ready for discharge.  She does not need any advanced imaging, she is aware of the indications for return, the patient is stable for discharge, mother is at the bedside and agreeable to the plan as well.  Final Clinical Impression(s) / ED Diagnoses Final diagnoses:  Gastroenteritis  Hypokalemia    Rx / DC Orders ED Discharge Orders         Ordered    ondansetron (ZOFRAN ODT) 4 MG disintegrating tablet  Every 8 hours PRN     Discontinue  Reprint     04/24/20 1530           Eber Hong, MD 04/24/20 1531

## 2020-04-24 NOTE — Discharge Instructions (Signed)
Please drink plenty of clear liquids, especially liquids that have potassium or electrolyte such as Gatorade.  You may take Zofran every 6 hours as needed for nausea.  Please rest at home and drink plenty of liquids for the next couple of days but return to the emergency department for severe or worsening symptoms.  You should have your potassium rechecked within the week to make sure it is back to normal

## 2020-04-24 NOTE — ED Triage Notes (Signed)
Patient c/o generalized abd pain with nausea, vomiting, and diarrhea. Denies any fevers or urinary symptoms. Per patient started on Thursday shortly after eating a hamburger. Denies any gastric hx. Denies any blood in stool but states small red streaks in emesis.

## 2020-04-26 ENCOUNTER — Other Ambulatory Visit: Payer: Self-pay | Admitting: Preventative Medicine

## 2020-04-26 DIAGNOSIS — R1010 Upper abdominal pain, unspecified: Secondary | ICD-10-CM

## 2020-04-26 LAB — URINE CULTURE: Culture: >=100000 — AB

## 2020-04-29 ENCOUNTER — Ambulatory Visit (HOSPITAL_COMMUNITY)
Admission: RE | Admit: 2020-04-29 | Discharge: 2020-04-29 | Disposition: A | Discharge status: Home / Self Care | Payer: 59 | Source: Ambulatory Visit | Attending: Preventative Medicine | Admitting: Preventative Medicine

## 2020-04-29 ENCOUNTER — Other Ambulatory Visit: Payer: Self-pay

## 2020-04-29 DIAGNOSIS — R1010 Upper abdominal pain, unspecified: Secondary | ICD-10-CM | POA: Diagnosis present

## 2021-08-09 IMAGING — US US ABDOMEN LIMITED
1 series · 14 of 25 positions shown · non-contrast
Comparison: None.

CLINICAL DATA: Upper abdominal pain

EXAM:
ULTRASOUND ABDOMEN LIMITED RIGHT UPPER QUADRANT

[Series 1: us abdomen limited ruq · 14 of 60 slices shown]
[im 1/60]
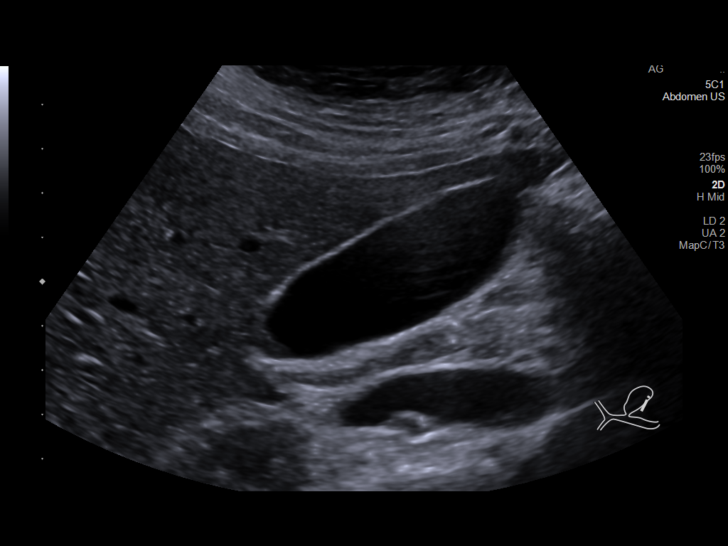
[im 5/60]
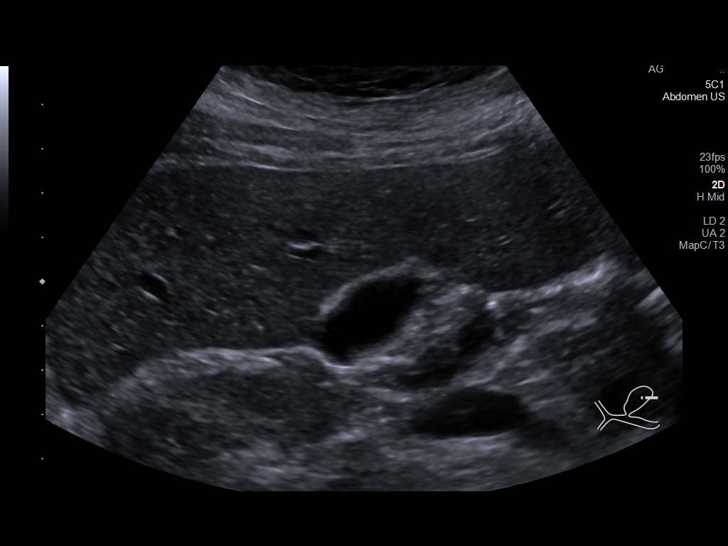
[im 10/60]
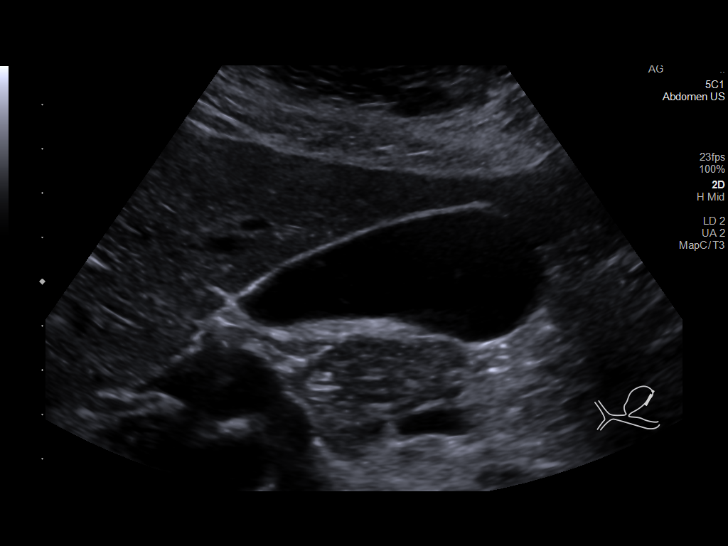
[im 15/60]
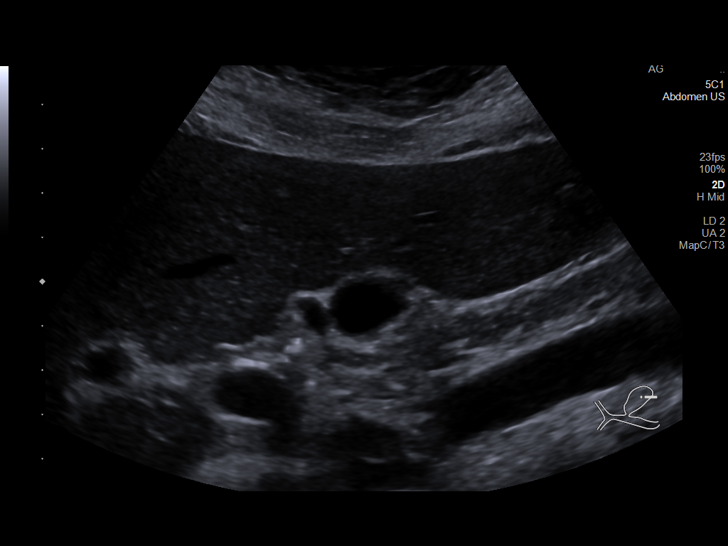
[im 20/60]
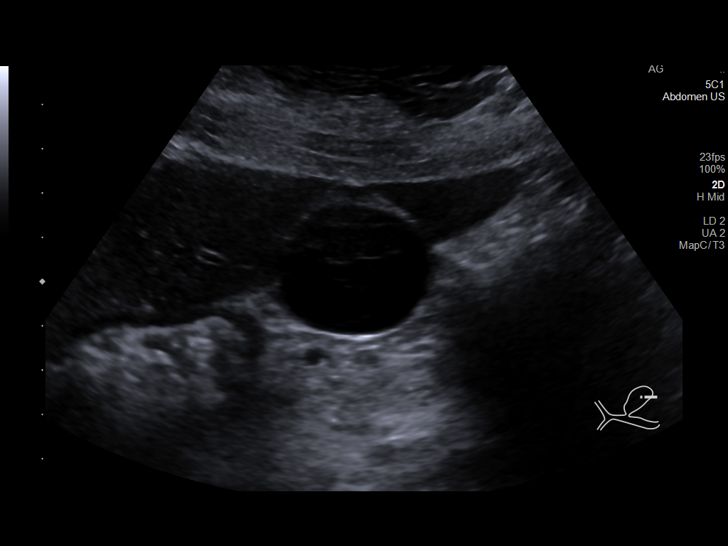
[im 23/60]
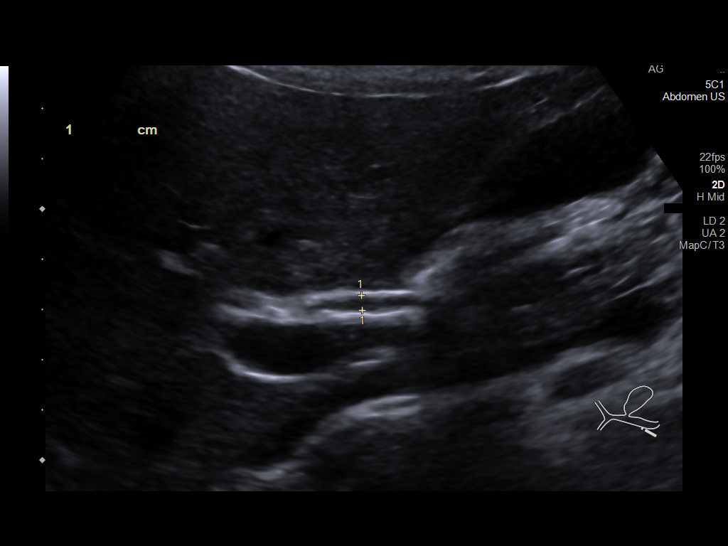
[im 28/60]
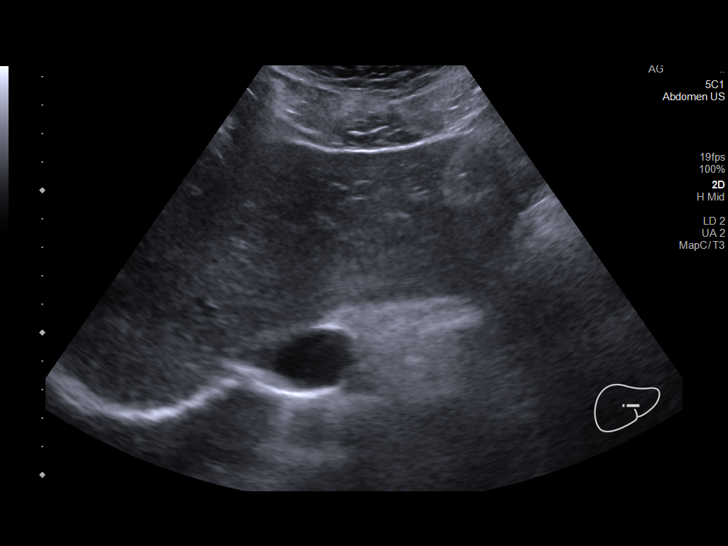
[im 32/60]
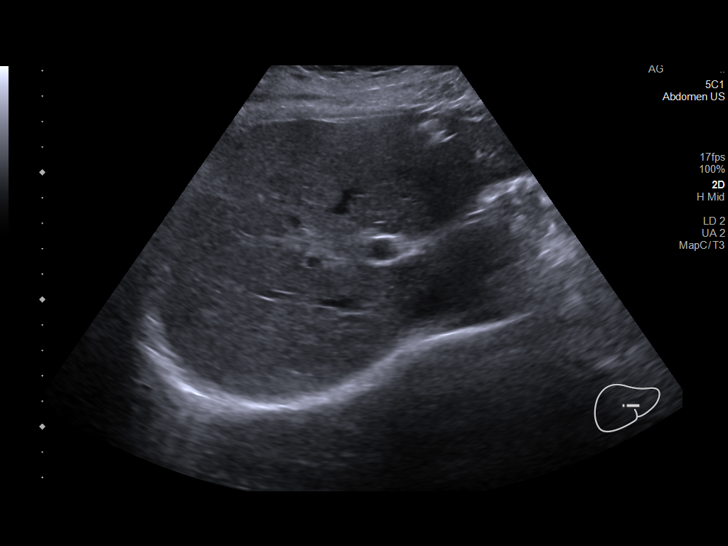
[im 37/60]
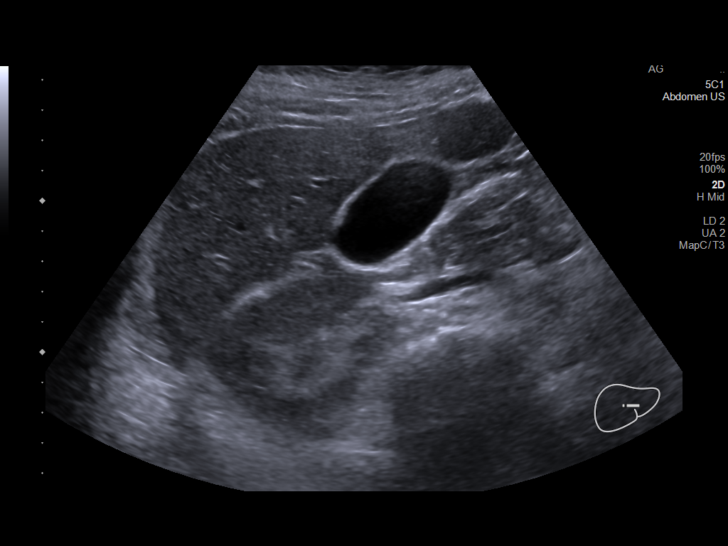
[im 40/60]
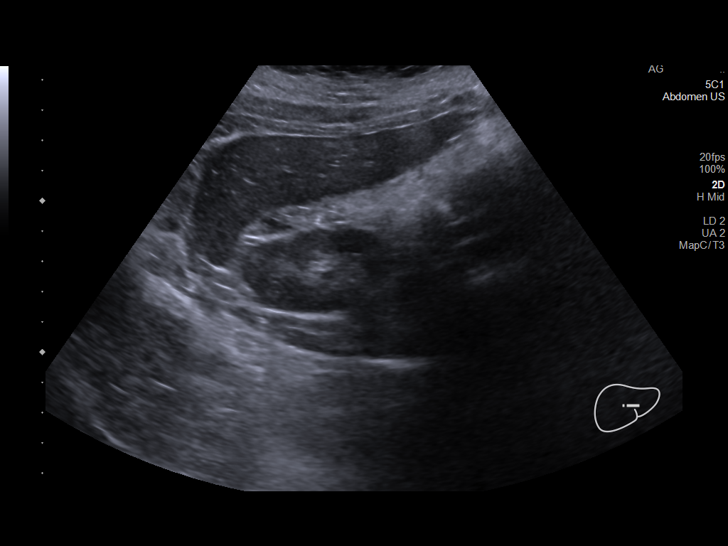
[im 45/60]
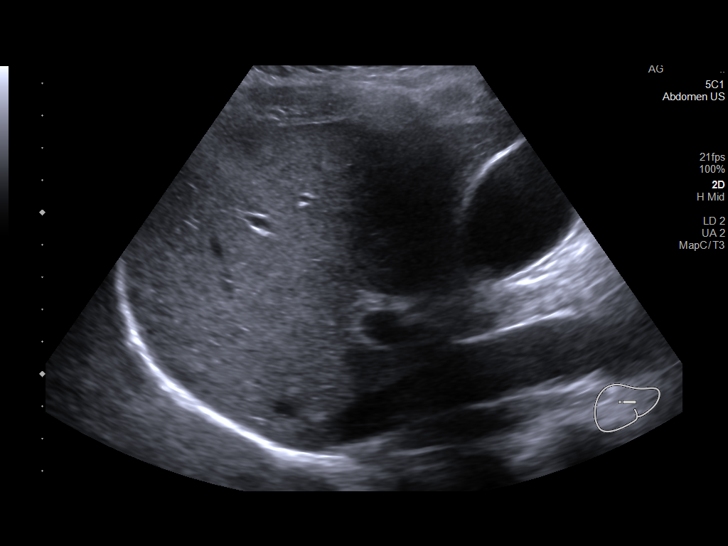
[im 50/60]
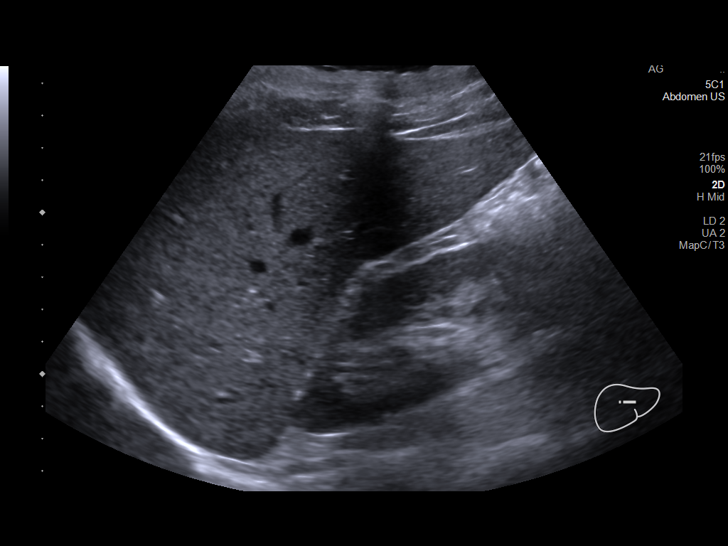
[im 55/60]
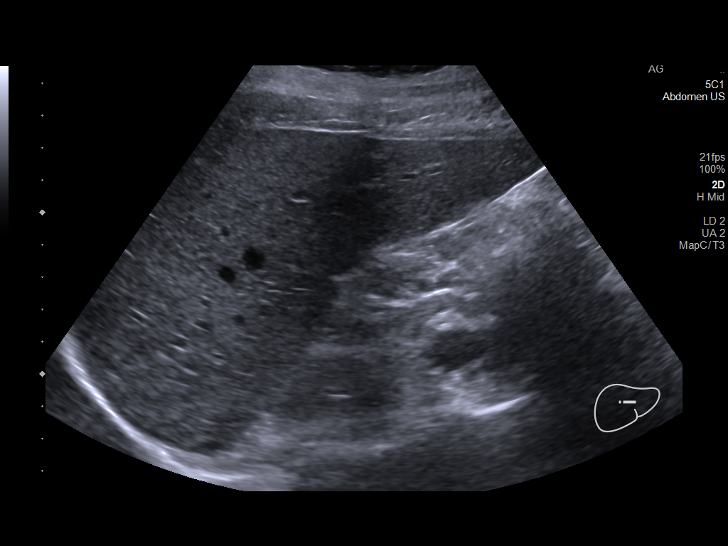
[im 60/60]
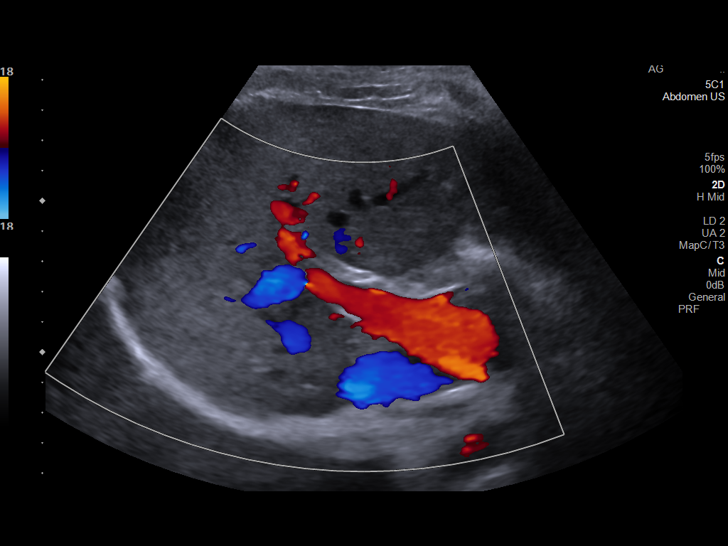

[14 of 25 positions shown; findings below may reference images not displayed]

FINDINGS: Gallbladder:

No gallstones or wall thickening visualized. No sonographic Murphy
sign noted by sonographer.

Common bile duct:

Diameter: 3 mm

Liver:

No focal lesion identified. Within normal limits in parenchymal
echogenicity. Portal vein is patent on color Doppler imaging with
normal direction of blood flow towards the liver.

Other: None.
IMPRESSION: Negative right upper quadrant abdominal ultrasound

## 2021-12-08 ENCOUNTER — Encounter (HOSPITAL_COMMUNITY): Payer: Self-pay

## 2021-12-08 ENCOUNTER — Emergency Department (HOSPITAL_COMMUNITY)
Admission: EM | Admit: 2021-12-08 | Discharge: 2021-12-08 | Disposition: A | Discharge status: Home / Self Care | Payer: 59 | Attending: Emergency Medicine | Admitting: Emergency Medicine

## 2021-12-08 ENCOUNTER — Other Ambulatory Visit: Payer: Self-pay

## 2021-12-08 DIAGNOSIS — F1290 Cannabis use, unspecified, uncomplicated: Secondary | ICD-10-CM | POA: Insufficient documentation

## 2021-12-08 DIAGNOSIS — R112 Nausea with vomiting, unspecified: Secondary | ICD-10-CM | POA: Insufficient documentation

## 2021-12-08 LAB — LIPASE, BLOOD: Lipase: 27 U/L (ref 11–51)

## 2021-12-08 LAB — CBC
HCT: 43.8 % (ref 36.0–46.0)
Hemoglobin: 15 g/dL (ref 12.0–15.0)
MCH: 29.4 pg (ref 26.0–34.0)
MCHC: 34.2 g/dL (ref 30.0–36.0)
MCV: 85.9 fL (ref 80.0–100.0)
Platelets: 354 10*3/uL (ref 150–400)
RBC: 5.1 MIL/uL (ref 3.87–5.11)
RDW: 13.9 % (ref 11.5–15.5)
WBC: 9.6 10*3/uL (ref 4.0–10.5)
nRBC: 0 % (ref 0.0–0.2)

## 2021-12-08 LAB — COMPREHENSIVE METABOLIC PANEL
ALT: 18 U/L (ref 0–44)
AST: 17 U/L (ref 15–41)
Albumin: 4.1 g/dL (ref 3.5–5.0)
Alkaline Phosphatase: 82 U/L (ref 38–126)
Anion gap: 12 (ref 5–15)
BUN: 8 mg/dL (ref 6–20)
CO2: 23 mmol/L (ref 22–32)
Calcium: 9.4 mg/dL (ref 8.9–10.3)
Chloride: 103 mmol/L (ref 98–111)
Creatinine, Ser: 0.83 mg/dL (ref 0.44–1.00)
GFR, Estimated: >60 mL/min (ref >60)
Glucose, Bld: 98 mg/dL (ref 70–99)
Potassium: 2.7 mmol/L — CL (ref 3.5–5.1)
Sodium: 138 mmol/L (ref 135–145)
Total Bilirubin: 1.3 mg/dL — ABNORMAL HIGH (ref 0.3–1.2)
Total Protein: 8.3 g/dL — ABNORMAL HIGH (ref 6.5–8.1)

## 2021-12-08 LAB — MAGNESIUM: Magnesium: 1.7 mg/dL (ref 1.7–2.4)

## 2021-12-08 MED ORDER — ONDANSETRON 4 MG PO TBDP
ORAL_TABLET | ORAL | Status: AC
  Filled 2021-12-08: qty 1

## 2021-12-08 MED ORDER — POTASSIUM CHLORIDE CRYS ER 20 MEQ PO TBCR
40.0000 meq | EXTENDED_RELEASE_TABLET | Freq: Once | ORAL | Status: AC
  Administered 2021-12-08: 40 meq via ORAL
  Filled 2021-12-08: qty 2

## 2021-12-08 MED ORDER — POTASSIUM CHLORIDE 10 MEQ/100ML IV SOLN
10.0000 meq | INTRAVENOUS | Status: AC
  Administered 2021-12-08 (×2): 10 meq via INTRAVENOUS
  Filled 2021-12-08 (×2): qty 100

## 2021-12-08 MED ORDER — ONDANSETRON HCL 4 MG PO TABS: 4.0000 mg | ORAL_TABLET | Freq: Four times a day (QID) | ORAL | 0 refills | Status: AC

## 2021-12-08 MED ORDER — OMEPRAZOLE 20 MG PO CPDR: 20.0000 mg | DELAYED_RELEASE_CAPSULE | Freq: Every day | ORAL | 0 refills | Status: AC

## 2021-12-08 MED ORDER — SODIUM CHLORIDE 0.9 % IV BOLUS
1000.0000 mL | Freq: Once | INTRAVENOUS | Status: AC
  Administered 2021-12-08: 1000 mL via INTRAVENOUS

## 2021-12-08 MED ORDER — ONDANSETRON 4 MG PO TBDP
4.0000 mg | ORAL_TABLET | Freq: Once | ORAL | Status: AC | PRN
  Administered 2021-12-08: 4 mg via ORAL

## 2021-12-08 NOTE — ED Triage Notes (Signed)
Reports vomiting since Monday.  Denies abd pain or diarrhea.  Also complains of chills.  ?

## 2021-12-08 NOTE — ED Notes (Signed)
Provided with urine specimen cup and aware of need for specimen ?

## 2021-12-08 NOTE — ED Provider Notes (Signed)
[facility] EMERGENCY DEPARTMENT Provider Note   CSN: [id number] Arrival date & time: 12/08/21  1351     History  Chief Complaint  Patient presents with   Emesis    Cindy Sosa is a 31 y.o. female.  HPI  Patient with medical history including marijuana use presents with complaining of nausea vomiting.  she states this started on Monday and she has been unable to tolerate p.o., she states that she had multiple episodes of vomiting states that she seen some bloodstained emesis but denies coffee-ground emesis or melena no constipation no diarrhea no stomach pains no fevers no chills no URI-like symptoms no general body aches, denies any recent sick contacts, she never immunocompromise, she denies any abnormal food intake, she does endorse that she has had in the past and they cannot determine what causes her symptoms.  She mentions that she is a daily marijuana user, states that she smoked Sunday prior to her emesis on Monday, she tried smoking yesterday and again started vomiting again.  She has no abdominal surgeries, denies alcohol use or NSAID use no history of bowel obstruction or stomach ulcers.  She has no other complaints at this time.  I have reviewed patient's chart she was seen 1 year ago for similar presentation lab work was all unremarkable Friday with antiemetics and later discharged home.  Home Medications Prior to Admission medications   Medication Sig Start Date End Date Taking? Authorizing Provider  omeprazole (PRILOSEC) 20 MG capsule Take 1 capsule (20 mg total) by mouth daily. 12/08/21 01/07/22 Yes Marcello Fennel, PA-C  ondansetron (ZOFRAN) 4 MG tablet Take 1 tablet (4 mg total) by mouth every 6 (six) hours. 12/08/21  Yes Marcello Fennel, PA-C  ondansetron (ZOFRAN ODT) 4 MG disintegrating tablet Take 1 tablet (4 mg total) by mouth every 8 (eight) hours as needed for nausea. Patient not taking: Reported on 12/08/2021 04/24/20   Noemi Chapel, MD      Allergies     Patient has no known allergies.    Review of Systems   Review of Systems  Constitutional:  Negative for chills and fever.  Respiratory:  Negative for shortness of breath.   Cardiovascular:  Negative for chest pain.  Gastrointestinal:  Positive for nausea and vomiting. Negative for abdominal pain.  Neurological:  Negative for headaches.   Physical Exam Updated Vital Signs BP 119/86 (BP Location: Left Arm)    Pulse 79    Temp 98.7 F (37.1 C) (Oral)    Resp 14    Ht 6' (1.829 m)    Wt 113.4 kg    SpO2 100%    BMI 33.91 kg/m  Physical Exam Vitals and nursing note reviewed.  Constitutional:      General: She is not in acute distress.    Appearance: She is not ill-appearing.  HENT:     Head: Normocephalic and atraumatic.     Nose: No congestion.  Eyes:     Conjunctiva/sclera: Conjunctivae normal.  Cardiovascular:     Rate and Rhythm: Normal rate and regular rhythm.     Pulses: Normal pulses.     Heart sounds: No murmur heard.   No friction rub. No gallop.  Pulmonary:     Effort: No respiratory distress.     Breath sounds: No wheezing, rhonchi or rales.  Abdominal:     Palpations: Abdomen is soft.     Tenderness: There is no abdominal tenderness. There is no right CVA tenderness or left CVA  tenderness.  Musculoskeletal:     Right lower leg: No edema.     Left lower leg: No edema.  Skin:    General: Skin is warm and dry.  Neurological:     Mental Status: She is alert.  Psychiatric:        Mood and Affect: Mood normal.    ED Results / Procedures / Treatments   Labs (all labs ordered are listed, but only abnormal results are displayed) Labs Reviewed  COMPREHENSIVE METABOLIC PANEL - Abnormal; Notable for the following components:      Result Value   Potassium 2.7 (*)    Total Protein 8.3 (*)    Total Bilirubin 1.3 (*)    All other components within normal limits  LIPASE, BLOOD  CBC  MAGNESIUM  URINALYSIS, ROUTINE W REFLEX MICROSCOPIC  RAPID URINE DRUG SCREEN, HOSP  PERFORMED  POC URINE PREG, ED    EKG None  Radiology No results found.  Procedures Procedures    Medications Ordered in ED Medications  ondansetron (ZOFRAN-ODT) disintegrating tablet 4 mg (4 mg Oral Given 12/08/21 1440)  sodium chloride 0.9 % bolus 1,000 mL (0 mLs Intravenous Stopped 12/08/21 1730)  potassium chloride SA (KLOR-CON M) CR tablet 40 mEq (40 mEq Oral Given 12/08/21 1608)  potassium chloride 10 mEq in 100 mL IVPB (10 mEq Intravenous New Bag/Given 12/08/21 1729)    ED Course/ Medical Decision Making/ A&P                           Medical Decision Making Amount and/or Complexity of Data Reviewed Labs: ordered.  Risk Prescription drug management.   This patient presents to the ED for concern of nausea vomiting, this involves an extensive number of treatment options, and is a complaint that carries with it a high risk of complications and morbidity.  The differential diagnosis includes obstruction, sepsis, electrode derailments,    Additional history obtained:  Additional history obtained from electronic medical record External records from outside source obtained and reviewed including please see HPI   Co morbidities that complicate the patient evaluation  Cannabis user  Social Determinants of Health:  N/A    Lab Tests:  I Ordered, and personally interpreted labs.  The pertinent results include: CBC unremarkable, CMP shows potassium 2.7 T. bili 1.3 lipase 27 magnesium 1.7   Imaging Studies ordered:  I ordered imaging studies including N/A    Cardiac Monitoring:  The patient was maintained on a cardiac monitor.  I personally viewed and interpreted the cardiac monitored which showed an underlying rhythm of: Sinus without signs of ischemia   Medicines ordered and prescription drug management:  I ordered medication including fluids, antiemetics for nausea vomiting I have reviewed the patients home medicines and have made adjustments as  needed  Reevaluation:  Presented with nausea and vomiting suspect hyperemesis due to cannabis use, will provide with fluids antiemetics and reassess  Patient had a potassium 2.7 will add on magnesium, provide with oral potassium with IV potassium p.o. challenge  Patient is reassessed after potassium, she is found resting calmly, has no complaints, vital signs reassuring, she is tolerating p.o., she is agreeable for discharge.  Test Considered:  Will defer on CT abdomen pelvis as she has a nonsurgical abdomen, nontoxic-appearing, she is tolerating p.o., still passing gas and having bowel movements.     Rule out low suspicion for lower lobe pneumonia as lung sounds are clear bilaterally, will defer imaging at this  time.  I have low suspicion for liver or gallbladder abnormality as she has no right upper quadrant tenderness, liver enzymes, alk phos, T bili all within normal limits.  Low suspicion for pancreatitis as lipase is within normal limits.  Low suspicion for ruptured stomach ulcer as she has no peritoneal sign present on exam.  Low suspicion for bowel obstruction as abdomen is nondistended normal bowel sounds, so passing gas and having normal bowel movements.  Low suspicion for complicated diverticulitis as she is nontoxic-appearing, vital signs reassuring no leukocytosis present.  Low suspicion for appendicitis as she has no right lower quadrant tenderness, vital signs reassuring.  I have low suspicion for intra-abdominal infection as she has low risk factors, vital signs reassuring, no leukocytosis, will defer imaging at this time.     Dispostion and problem list  After consideration of the diagnostic results and the patients response to treatment, I feel that the patent would benefit from discharge.  Nausea vomiting since resolved-I suspect patient suffering from hyperemesis from cannabis use, counseled on decreasing on cannabis use we will provide with antiemetics follow-up with  PCP as needed.  Gave strict return precautions.            Final Clinical Impression(s) / ED Diagnoses Final diagnoses:  Nausea and vomiting, unspecified vomiting type    Rx / DC Orders ED Discharge Orders          Ordered    ondansetron (ZOFRAN) 4 MG tablet  Every 6 hours        12/08/21 1858    omeprazole (PRILOSEC) 20 MG capsule  Daily        12/08/21 1900              Aron Baba 12/08/21 1900    Milton Ferguson, MD 12/10/21 [phone]

## 2021-12-08 NOTE — Discharge Instructions (Addendum)
Lab work showed that you had low potassium I have replenished it here in the emergency department, I would like you to abstain from marijuana use as suspect this is causing  nausea and vomiting.  I given you Zofran please take as prescribed. ? ?Come back to the emergency department if you develop chest pain, shortness of breath, severe abdominal pain, uncontrolled nausea, vomiting, diarrhea. ? ?
